# Patient Record
Sex: Female | Born: 1990 | Race: White | Hispanic: No | Marital: Single | Smoking: Former smoker
Health system: Southern US, Community
[De-identification: ages and names within clinical notes are randomized; demographics above are authoritative.]

## PROBLEM LIST (undated history)

## (undated) DIAGNOSIS — G8929 Other chronic pain: Secondary | ICD-10-CM

## (undated) DIAGNOSIS — Z8639 Personal history of other endocrine, nutritional and metabolic disease: Secondary | ICD-10-CM

## (undated) DIAGNOSIS — M419 Scoliosis, unspecified: Secondary | ICD-10-CM

## (undated) DIAGNOSIS — G319 Degenerative disease of nervous system, unspecified: Secondary | ICD-10-CM

## (undated) DIAGNOSIS — R109 Unspecified abdominal pain: Secondary | ICD-10-CM

## (undated) DIAGNOSIS — I071 Rheumatic tricuspid insufficiency: Secondary | ICD-10-CM

## (undated) HISTORY — DX: Scoliosis, unspecified: M41.9

## (undated) HISTORY — DX: Other chronic pain: G89.29

## (undated) HISTORY — DX: Degenerative disease of nervous system, unspecified: G31.9

## (undated) HISTORY — DX: Unspecified abdominal pain: R10.9

## (undated) HISTORY — PX: OTHER SURGICAL HISTORY: SHX169

## (undated) HISTORY — PX: LEEP: SHX91

## (undated) HISTORY — DX: Personal history of other endocrine, nutritional and metabolic disease: Z86.39

---
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
## 2012-02-29 ENCOUNTER — Encounter (HOSPITAL_COMMUNITY): Payer: Self-pay | Admitting: Emergency Medicine

## 2012-02-29 ENCOUNTER — Emergency Department (HOSPITAL_COMMUNITY)
Admission: EM | Admit: 2012-02-29 | Discharge: 2012-02-29 | Disposition: A | Discharge status: Home / Self Care | Payer: Self-pay | Attending: Emergency Medicine | Admitting: Emergency Medicine

## 2012-02-29 ENCOUNTER — Emergency Department (HOSPITAL_COMMUNITY): Payer: Self-pay

## 2012-02-29 DIAGNOSIS — J029 Acute pharyngitis, unspecified: Secondary | ICD-10-CM | POA: Insufficient documentation

## 2012-02-29 DIAGNOSIS — J111 Influenza due to unidentified influenza virus with other respiratory manifestations: Secondary | ICD-10-CM

## 2012-02-29 DIAGNOSIS — IMO0001 Reserved for inherently not codable concepts without codable children: Secondary | ICD-10-CM | POA: Insufficient documentation

## 2012-02-29 DIAGNOSIS — Z79899 Other long term (current) drug therapy: Secondary | ICD-10-CM | POA: Insufficient documentation

## 2012-02-29 DIAGNOSIS — Z8709 Personal history of other diseases of the respiratory system: Secondary | ICD-10-CM | POA: Insufficient documentation

## 2012-02-29 DIAGNOSIS — R490 Dysphonia: Secondary | ICD-10-CM | POA: Insufficient documentation

## 2012-02-29 DIAGNOSIS — R059 Cough, unspecified: Secondary | ICD-10-CM | POA: Insufficient documentation

## 2012-02-29 DIAGNOSIS — J3489 Other specified disorders of nose and nasal sinuses: Secondary | ICD-10-CM | POA: Insufficient documentation

## 2012-02-29 DIAGNOSIS — R05 Cough: Secondary | ICD-10-CM | POA: Insufficient documentation

## 2012-02-29 DIAGNOSIS — R509 Fever, unspecified: Secondary | ICD-10-CM | POA: Insufficient documentation

## 2012-02-29 DIAGNOSIS — R51 Headache: Secondary | ICD-10-CM | POA: Insufficient documentation

## 2012-02-29 DIAGNOSIS — R000 Tachycardia, unspecified: Secondary | ICD-10-CM | POA: Insufficient documentation

## 2012-02-29 MED ORDER — HYDROCOD POLST-CHLORPHEN POLST 10-8 MG/5ML PO LQCR
5.0000 mL | Freq: Once | ORAL | Status: AC
  Administered 2012-02-29: 5 mL via ORAL
  Filled 2012-02-29: qty 5

## 2012-02-29 MED ORDER — PROMETHAZINE-CODEINE 6.25-10 MG/5ML PO SYRP: 5.0000 mL | ORAL_SOLUTION | Freq: Four times a day (QID) | ORAL | Status: DC | PRN

## 2012-02-29 MED ORDER — IBUPROFEN 800 MG PO TABS
800.0000 mg | ORAL_TABLET | Freq: Once | ORAL | Status: AC
  Administered 2012-02-29: 800 mg via ORAL
  Filled 2012-02-29: qty 1

## 2012-02-29 NOTE — ED Provider Notes (Signed)
Medical screening examination/treatment/procedure(s) were performed by non-physician practitioner and as supervising physician I was immediately available for consultation/collaboration.   Antionetta Ator L Faven Watterson, MD 02/29/12 1413 

## 2012-02-29 NOTE — ED Notes (Signed)
Pt c/o sore throat/fever/chills/cough/congestion x 3 days.

## 2012-02-29 NOTE — ED Provider Notes (Signed)
History     CSN: [id number]  Arrival date & time 02/29/12  0801   First MD Initiated Contact with Patient 02/29/12 [phone]      Chief Complaint  Patient presents with  . Sore Throat  . Cough  . Nasal Congestion    (Consider location/radiation/quality/duration/timing/severity/associated sxs/prior treatment) Patient is a 21 y.o. female presenting with cough. The history is provided by the patient.  Cough This is a new problem. The current episode started more than 2 days ago. The problem occurs every few minutes. The problem has been gradually worsening. The cough is non-productive. The maximum temperature recorded prior to her arrival was 102 to 102.9 F. Associated symptoms include chills, headaches, sore throat and myalgias. Pertinent negatives include no chest pain, no shortness of breath and no wheezing. Associated symptoms comments: hoarseness. Treatments tried: tylenol, ibuprofen. The treatment provided no relief. She is not a smoker. Her past medical history is significant for bronchitis. Her past medical history does not include pneumonia or asthma.    History reviewed. No pertinent past medical history.  History reviewed. No pertinent past surgical history.  No family history on file.  History  Substance Use Topics  . Smoking status: Never Smoker   . Smokeless tobacco: Not on file  . Alcohol Use: No    OB History    Grav Para Term Preterm Abortions TAB SAB Ect Mult Living                  Review of Systems  Constitutional: Positive for chills. Negative for activity change.       All ROS Neg except as noted in HPI  HENT: Positive for sore throat. Negative for nosebleeds and neck pain.   Eyes: Negative for photophobia and discharge.  Respiratory: Positive for cough. Negative for shortness of breath and wheezing.   Cardiovascular: Negative for chest pain and palpitations.  Gastrointestinal: Negative for abdominal pain and blood in stool.  Genitourinary: Negative for  dysuria, frequency and hematuria.  Musculoskeletal: Positive for myalgias. Negative for back pain and arthralgias.  Skin: Negative.   Neurological: Positive for headaches. Negative for dizziness, seizures and speech difficulty.  Psychiatric/Behavioral: Negative for hallucinations and confusion.    Allergies  Review of patient's allergies indicates no known allergies.  Home Medications   Current Outpatient Rx  Name  Route  Sig  Dispense  Refill  . IBUPROFEN 200 MG PO TABS   Oral   Take 600 mg by mouth every 6 (six) hours as needed. For headaches/fever         . PSEUDOEPH-DOXYLAMINE-DM-APAP 60-7.08-04-998 MG/30ML PO LIQD   Oral   Take 30 mLs by mouth at bedtime.         Marland Kitchen PSEUDOEPHEDRINE-APAP-DM 86-578-46 MG/30ML PO LIQD   Oral   Take 30 mLs by mouth daily.           BP 120/69  Pulse 107  Temp 98.4 F (36.9 C)  Resp 20  Ht 5\' 5"  (1.651 m)  Wt 140 lb (63.504 kg)  BMI 23.30 kg/m2  SpO2 97%  LMP 01/12/2012  Physical Exam  Nursing note and vitals reviewed. Constitutional: She is oriented to person, place, and time. She appears well-developed and well-nourished.  Non-toxic appearance.  HENT:  Head: Normocephalic.  Right Ear: Tympanic membrane and external ear normal.  Left Ear: Tympanic membrane and external ear normal.       Nasal congestion. Mild to mod hoarseness noted. Airway patent. Minimal redness of the posterior pharynx.  Eyes: EOM and lids are normal. Pupils are equal, round, and reactive to light.  Neck: Normal range of motion. Neck supple. Carotid bruit is not present.  Cardiovascular: Regular rhythm, normal heart sounds, intact distal pulses and normal pulses.  Tachycardia present.   Pulmonary/Chest: No respiratory distress.       course breath sounds. Symmetrical rise and fall of the chest.  Abdominal: Soft. Bowel sounds are normal. There is no tenderness. There is no guarding.  Musculoskeletal: Normal range of motion. She exhibits no edema.   Lymphadenopathy:       Head (right side): No submandibular adenopathy present.       Head (left side): No submandibular adenopathy present.    She has no cervical adenopathy.  Neurological: She is alert and oriented to person, place, and time. She has normal strength. No cranial nerve deficit or sensory deficit. She exhibits normal muscle tone. Coordination normal.  Skin: Skin is warm and dry. No rash noted.  Psychiatric: She has a normal mood and affect. Her speech is normal.    ED Course  Procedures (including critical care time)  Labs Reviewed - No data to display Dg Chest 2 View  02/29/2012  *RADIOLOGY REPORT*  Clinical Data: Cough, congestion, fever  CHEST - 2 VIEW  Comparison:  None.  Findings:  The heart size and mediastinal contours are within normal limits.  Both lungs are clear.  The visualized skeletal structures are unremarkable.  IMPRESSION: No active cardiopulmonary disease.   Original Report Authenticated By: Judie Petit. Miles Costain, M.D.      No diagnosis found.    MDM  I have reviewed nursing notes, vital signs, and all appropriate lab and imaging results for this patient. The chest x-ray is negative for acute cardiopulmonary disease. The pulse rate is elevated at 107, the vital signs are otherwise within normal limits. Review of the patient's history and examination suggest influenza. Patient advised to increase fluids, wash hands frequently, use ibuprofen every 6 hours for fever and aching, and prescription given for promethazine cough medication every 6 hours. Patient to return if any changes, problems, or concerns.       Kathie Dike, [place] 02/29/12 1017

## 2012-03-03 ENCOUNTER — Encounter (HOSPITAL_COMMUNITY): Payer: Self-pay | Admitting: *Deleted

## 2012-03-03 ENCOUNTER — Emergency Department (HOSPITAL_COMMUNITY)
Admission: EM | Admit: 2012-03-03 | Discharge: 2012-03-03 | Disposition: A | Discharge status: Home / Self Care | Payer: Self-pay | Attending: Emergency Medicine | Admitting: Emergency Medicine

## 2012-03-03 DIAGNOSIS — J0110 Acute frontal sinusitis, unspecified: Secondary | ICD-10-CM | POA: Insufficient documentation

## 2012-03-03 DIAGNOSIS — J029 Acute pharyngitis, unspecified: Secondary | ICD-10-CM | POA: Insufficient documentation

## 2012-03-03 DIAGNOSIS — J3489 Other specified disorders of nose and nasal sinuses: Secondary | ICD-10-CM | POA: Insufficient documentation

## 2012-03-03 DIAGNOSIS — Z79899 Other long term (current) drug therapy: Secondary | ICD-10-CM | POA: Insufficient documentation

## 2012-03-03 DIAGNOSIS — R509 Fever, unspecified: Secondary | ICD-10-CM | POA: Insufficient documentation

## 2012-03-03 DIAGNOSIS — R0982 Postnasal drip: Secondary | ICD-10-CM | POA: Insufficient documentation

## 2012-03-03 DIAGNOSIS — IMO0001 Reserved for inherently not codable concepts without codable children: Secondary | ICD-10-CM | POA: Insufficient documentation

## 2012-03-03 MED ORDER — BENZONATATE 100 MG PO CAPS: 200.0000 mg | ORAL_CAPSULE | Freq: Three times a day (TID) | ORAL | Status: DC | PRN

## 2012-03-03 MED ORDER — AMOXICILLIN-POT CLAVULANATE 875-125 MG PO TABS: 1.0000 | ORAL_TABLET | Freq: Two times a day (BID) | ORAL | Status: DC

## 2012-03-03 MED ORDER — HYDROCODONE-ACETAMINOPHEN 7.5-500 MG/15ML PO SOLN: 10.0000 mL | Freq: Four times a day (QID) | ORAL | Status: DC | PRN

## 2012-03-03 NOTE — ED Notes (Signed)
Pt says she has been sick for 8 days.  Nasal congestion , green-brown sputum.  No NVD, seen here and dx with flu.  But says she is no better.

## 2012-03-03 NOTE — ED Notes (Signed)
Pt states she was here recently and dx with the flu. States productive cough, green in color and congestion has gotten worse. Denies N/V/D

## 2012-03-05 NOTE — ED Provider Notes (Signed)
Medical screening examination/treatment/procedure(s) were performed by non-physician practitioner and as supervising physician I was immediately available for consultation/collaboration.  Rodrickus Min, MD 03/05/12 1533 

## 2012-03-05 NOTE — ED Provider Notes (Signed)
History     CSN: [id number]  Arrival date & time 03/03/12  1041   First MD Initiated Contact with Patient 03/03/12 1057      Chief Complaint  Patient presents with  . Cough  . Nasal Congestion    (Consider location/radiation/quality/duration/timing/severity/associated sxs/prior treatment) HPI Comments: Madison Pratt presents for further evaluation of flu like symptoms stating she feels worse than when she was seen here 3 days ago.  She describes a 8 day history of sore throat, nasal congestion, fever, chills, myalgias and nonproductive cough. Her chest xray 3 days ago was normal and she currently denies any worsened chest complaint such as sob, wheezing or worse cough,  But she has developed increased thick green and brown tinged nasal congestion and post nasal drip along with increased sinus pain and pressure. She was prescribed phenergan with codeine which has not slowed her cough.  She is also using ibuprofen and nyquil without much relief.  She has not slept well in 2 days secondary to cough.  She is fatigued,  But denies weakness or feeling lightheaded.  The history is provided by the patient.    History reviewed. No pertinent past medical history.  Past Surgical History  Procedure Date  . Cranial surgery     No family history on file.  History  Substance Use Topics  . Smoking status: Never Smoker   . Smokeless tobacco: Not on file  . Alcohol Use: No    OB History    Grav Para Term Preterm Abortions TAB SAB Ect Mult Living                  Review of Systems  Constitutional: Positive for fever and chills.  HENT: Positive for congestion, sore throat, rhinorrhea, postnasal drip and sinus pressure. Negative for ear pain and neck pain.   Eyes: Negative.  Negative for discharge.  Respiratory: Positive for cough. Negative for chest tightness and shortness of breath.   Cardiovascular: Negative for chest pain.  Gastrointestinal: Negative for nausea and abdominal pain.    Genitourinary: Negative.   Musculoskeletal: Positive for myalgias. Negative for joint swelling and arthralgias.  Skin: Negative.  Negative for rash and wound.  Neurological: Negative for dizziness, weakness, light-headedness, numbness and headaches.  Hematological: Negative.   Psychiatric/Behavioral: Negative.     Allergies  Review of patient's allergies indicates no known allergies.  Home Medications   Current Outpatient Rx  Name  Route  Sig  Dispense  Refill  . AMOXICILLIN-POT CLAVULANATE 875-125 MG PO TABS   Oral   Take 1 tablet by mouth every 12 (twelve) hours.   20 tablet   0   . BENZONATATE 100 MG PO CAPS   Oral   Take 2 capsules (200 mg total) by mouth 3 (three) times daily as needed for cough.   21 capsule   0   . HYDROCODONE-ACETAMINOPHEN 7.5-500 MG/15ML PO SOLN   Oral   Take 10 mLs by mouth every 6 (six) hours as needed for pain or cough.   120 mL   0   . IBUPROFEN 200 MG PO TABS   Oral   Take 600 mg by mouth every 6 (six) hours as needed. For headaches/fever         . PROMETHAZINE-CODEINE 6.25-10 MG/5ML PO SYRP   Oral   Take 5 mLs by mouth every 6 (six) hours as needed for cough.   150 mL   0   . PSEUDOEPH-DOXYLAMINE-DM-APAP 60-7.08-04-998 MG/30ML PO LIQD  Oral   Take 30 mLs by mouth at bedtime.         Marland Kitchen PSEUDOEPHEDRINE-APAP-DM 16-109-60 MG/30ML PO LIQD   Oral   Take 30 mLs by mouth daily.           BP 129/83  Pulse 105  Temp 98.2 F (36.8 C) (Oral)  Resp 16  Ht 5\' 5"  (1.651 m)  Wt 140 lb (63.504 kg)  BMI 23.30 kg/m2  SpO2 100%  LMP 02/11/2012  Physical Exam  Constitutional: She is oriented to person, place, and time. She appears well-developed and well-nourished.  HENT:  Head: Normocephalic and atraumatic.  Right Ear: Tympanic membrane, external ear and ear canal normal.  Left Ear: Tympanic membrane, external ear and ear canal normal.  Nose: Mucosal edema and rhinorrhea present. No epistaxis. Right sinus exhibits frontal  sinus tenderness.  Mouth/Throat: Uvula is midline and mucous membranes are normal. Posterior oropharyngeal erythema present. No oropharyngeal exudate, posterior oropharyngeal edema or tonsillar abscesses.  Eyes: Conjunctivae normal are normal.  Cardiovascular: Normal rate and normal heart sounds.   Pulmonary/Chest: Effort normal. No respiratory distress. She has no decreased breath sounds. She has no wheezes. She has no rhonchi. She has no rales.  Abdominal: Soft. There is no tenderness.  Musculoskeletal: Normal range of motion.  Neurological: She is alert and oriented to person, place, and time.  Skin: Skin is warm and dry. No rash noted.  Psychiatric: She has a normal mood and affect.    ED Course  Procedures (including critical care time)  Labs Reviewed - No data to display No results found.   1. Sinusitis, acute frontal       MDM  Prior xray reviewed.  Lung exam today is normal,  No indication to repeat cxray.  Pt with influenza/viral type illness which appears to have progressed to sinusitis.  She was prescribed augmentin,  Also prescribed tessalon for cough - dose given in ed helped "a little",  So also prescribed lortab elixer for improved cough suppression and so pt can rest. Cautioned re tylenol in dayquil and nyqul products - in addition to tylenol in lortab prescription.  Rest, fluids.  Return for any worsened sx.        Burgess Amor, PA 03/05/12 1149

## 2012-04-25 ENCOUNTER — Emergency Department (HOSPITAL_COMMUNITY)
Admission: EM | Admit: 2012-04-25 | Discharge: 2012-04-25 | Disposition: A | Discharge status: Home / Self Care | Payer: Self-pay | Attending: Emergency Medicine | Admitting: Emergency Medicine

## 2012-04-25 ENCOUNTER — Encounter (HOSPITAL_COMMUNITY): Payer: Self-pay

## 2012-04-25 DIAGNOSIS — T7492XA Unspecified child maltreatment, confirmed, initial encounter: Secondary | ICD-10-CM | POA: Insufficient documentation

## 2012-04-25 DIAGNOSIS — S5010XA Contusion of unspecified forearm, initial encounter: Secondary | ICD-10-CM | POA: Insufficient documentation

## 2012-04-25 DIAGNOSIS — S0083XA Contusion of other part of head, initial encounter: Secondary | ICD-10-CM

## 2012-04-25 DIAGNOSIS — T7491XA Unspecified adult maltreatment, confirmed, initial encounter: Secondary | ICD-10-CM | POA: Insufficient documentation

## 2012-04-25 DIAGNOSIS — Z9889 Other specified postprocedural states: Secondary | ICD-10-CM | POA: Insufficient documentation

## 2012-04-25 DIAGNOSIS — IMO0002 Reserved for concepts with insufficient information to code with codable children: Secondary | ICD-10-CM | POA: Insufficient documentation

## 2012-04-25 DIAGNOSIS — S8010XA Contusion of unspecified lower leg, initial encounter: Secondary | ICD-10-CM | POA: Insufficient documentation

## 2012-04-25 DIAGNOSIS — S40029A Contusion of unspecified upper arm, initial encounter: Secondary | ICD-10-CM | POA: Insufficient documentation

## 2012-04-25 DIAGNOSIS — T07XXXA Unspecified multiple injuries, initial encounter: Secondary | ICD-10-CM

## 2012-04-25 DIAGNOSIS — S0003XA Contusion of scalp, initial encounter: Secondary | ICD-10-CM | POA: Insufficient documentation

## 2012-04-25 MED ORDER — IBUPROFEN 800 MG PO TABS: 800.0000 mg | ORAL_TABLET | Freq: Once | ORAL | Status: DC
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
## 2012-12-08 ENCOUNTER — Encounter (HOSPITAL_COMMUNITY): Payer: Self-pay

## 2012-12-08 ENCOUNTER — Emergency Department (HOSPITAL_COMMUNITY)
Admission: EM | Admit: 2012-12-08 | Discharge: 2012-12-08 | Disposition: A | Discharge status: Home / Self Care | Payer: Self-pay | Attending: Emergency Medicine | Admitting: Emergency Medicine

## 2012-12-08 DIAGNOSIS — L299 Pruritus, unspecified: Secondary | ICD-10-CM | POA: Insufficient documentation

## 2012-12-08 DIAGNOSIS — R21 Rash and other nonspecific skin eruption: Secondary | ICD-10-CM | POA: Insufficient documentation

## 2012-12-08 MED ORDER — PERMETHRIN 5 % EX CREA: TOPICAL_CREAM | CUTANEOUS | Status: DC

## 2012-12-08 NOTE — ED Notes (Signed)
Complain of scattered rash and itching for a week

## 2012-12-08 NOTE — ED Provider Notes (Signed)
CSN: [id number]     Arrival date & time 12/08/12  4098 History  This chart was scribed for Joya Gaskins, MD by Bennett Scrape, ED Scribe. This patient was seen in room APA01/[facility]01 and the patient's care was started at 8:54 AM.   Chief Complaint  Patient presents with  . Rash    Patient is a 22 y.o. female presenting with rash. The history is provided by the patient. No language interpreter was used.  Rash Location:  Leg Leg rash location:  L upper leg, R upper leg, L lower leg, R lower leg, L toes and R toes Quality: itchiness and redness   Onset quality:  Gradual Duration:  1 week Timing:  Constant Progression:  Spreading Chronicity:  New Context: not insect bite/sting and not sick contacts   Relieved by:  Nothing Worsened by:  Nothing tried Ineffective treatments:  None tried Associated symptoms: no shortness of breath, no throat swelling and no tongue swelling     HPI Comments: Madison Pratt is a 22 y.o. female who presents to the Emergency Department complaining of red and itchy rash that started in between her toes and has spread up to her bilateral thighs and antecubital regions for the past week. She [place] but denies any known contact with it. She denies having a h/o allergies. She does not remember a specific insect bite and denies any sick contacts in her home currently. Husband states that he has had a much milder version of the same rash that resolved after a few weeks. Pt denies SOB, CP, trouble swallowing as associated symptoms.  History reviewed. No pertinent past medical history. Past Surgical History  Procedure Laterality Date  . Cranial surgery     No family history on file. History  Substance Use Topics  . Smoking status: Never Smoker   . Smokeless tobacco: Not on file  . Alcohol Use: No   No OB history provided.  Review of Systems  HENT: Negative for trouble swallowing.   Respiratory: Negative for shortness of  breath.   Skin: Positive for rash.    Allergies  Review of patient's allergies indicates no known allergies.  Home Medications   Current Outpatient Rx  Name  Route  Sig  Dispense  Refill  . ibuprofen (ADVIL,MOTRIN) 200 MG tablet   Oral   Take 600 mg by mouth every 6 (six) hours as needed. For headaches/fever          Triage Vitals: BP 124/77  Pulse 83  Temp(Src) 97.9 F (36.6 C) (Oral)  Resp 18  SpO2 99%  LMP 12/01/2012  Physical Exam  Nursing note and vitals reviewed.  CONSTITUTIONAL: Well developed/well nourished HEAD: Normocephalic/atraumatic EYES: EOMI ENMT: Mucous membranes moist NECK: supple no meningeal signs NEURO: Pt is awake/alert, moves all extremitiesx4 EXTREMITIES: pulses normal, full ROM SKIN: warm, color normal, scattered papules to lower extremities, inner thighs and feet PSYCH: no abnormalities of mood noted;  ED Course  Procedures   DIAGNOSTIC STUDIES: Oxygen Saturation is 99% on room air, normal by my interpretation.    COORDINATION OF CARE: 8:57 AM-Informed pt of suspicion of scabies. Discussed discharge plan which includes cream and benadryl with pt and pt agreed to plan. Also advised pt to follow up as needed within the next few weeks if symptoms and pt agreed. Addressed symptoms to return for with pt.     MDM  No diagnosis found. Nursing notes including past medical history and  social history reviewed and considered in documentation   I personally performed the services described in this documentation, which was scribed in my presence. The recorded information has been reviewed and is accurate.      Joya Gaskins, MD 12/08/12 [phone]

## 2013-11-30 ENCOUNTER — Ambulatory Visit (HOSPITAL_COMMUNITY)
Admission: RE | Admit: 2013-11-30 | Discharge: 2013-11-30 | Disposition: A | Discharge status: Home / Self Care | Payer: Medicaid Other | Source: Ambulatory Visit | Attending: Nurse Practitioner | Admitting: Nurse Practitioner

## 2013-11-30 DIAGNOSIS — R262 Difficulty in walking, not elsewhere classified: Secondary | ICD-10-CM | POA: Diagnosis not present

## 2013-11-30 DIAGNOSIS — R109 Unspecified abdominal pain: Secondary | ICD-10-CM | POA: Diagnosis not present

## 2013-11-30 DIAGNOSIS — IMO0001 Reserved for inherently not codable concepts without codable children: Secondary | ICD-10-CM | POA: Insufficient documentation

## 2013-11-30 NOTE — Evaluation (Signed)
Physical Therapy Evaluation  Patient Details  Name: Madison Pratt MRN: [id number] Date of Birth: 1991-01-05  Today's Date: 11/30/2013 Time: 8469-6295 PT Time Calculation (min): 40 min     Charges: 1 Evaluation         Visit#: 1 of 1  Re-eval:   Assessment Diagnosis: Abdominal weakness Surgical Date: 09/29/13 Prior Therapy: no  Authorization: Medicaid     Past Medical History: No past medical history on file. Past Surgical History:  Past Surgical History  Procedure Laterality Date  . Cranial surgery      Subjective Symptoms/Limitations Pertinent History: Patient states she had her baby 2 months ago and she was told her abdominal muslces have not healed correctly. Patient had a c-section. Patient plays soccer, exercises a lot. Patient notes pain with any exercise now. negative hernia an other problems accorting to CT scan.  How long can you sit comfortably?: no How long can you stand comfortably?: no How long can you walk comfortably?: no Pain Assessment Currently in Pain?: Yes Pain Score: 10-Worst pain ever Pain Location: Umbilicus Pain Orientation: Medial Pain Radiating Towards: lower abdominals and mid back. Pain Relieving Factors: nothing Effect of Pain on Daily Activities: pain with everything.   Cognition/Observation Observation/Other Assessments Observations: Gait: WNL Other Assessments: Trunk/abdominal pain 3D hip excursions  Sensation/Coordination/Flexibility/Functional Tests Functional Tests Functional Tests: 2D Thoracic spine excursions: no pain with Rt side bending or twisting to Rt, increased pain with Lt sidebending/twisting  Assessment Lumbar Strength Overall Lumbar Strength Comments: painfull with flexion and extension.  Lumbar Flexion: 3+/5 Lumbar Extension: 3+/5  Physical Therapy Assessment and Plan PT Assessment and Plan Clinical Impression Statement: Patient displays abdominal pain following cecerian section 2 months ago. Pain has been  present ever since giving birth, Patient was able to perform quadraped exercises for abdominal strengtheing without pain and low level plankg exercises to a raised table without pain. Patient noted no increase in pain during or following these exercises only the weakness in her trunk. patient would benefit from conitnued skilled therapy to increase core strength but is unable to fford further therapy.  Remainder of session utilized to educate patient in performance of exercises for HEP to strengtheen and improve mobility of abdominal muscles.  Pt will benefit from skilled therapeutic intervention in order to improve on the following deficits: Decreased activity tolerance;Increased fascial restricitons;Pain;Decreased strength;Difficulty walking;Impaired flexibility Rehab Potential: Excellent PT Plan: Patient discharged with HEP    Problem List There are no active problems to display for this patient.   PT - End of Session Activity Tolerance: Patient tolerated treatment well General Behavior During Therapy: WFL for tasks assessed/performed PT Plan of Care PT Home Exercise Plan: Quadraped exercise progresson, 3D UE weight shifts, UE ground matrix, LE ground matrix. 3D thoracic spine excursions with overhead reaches  GP    Said Rueb R 11/30/2013, 2:10 PM  Physician Documentation Your signature is required to indicate approval of the treatment plan as stated above.  Please sign and either send electronically or make a copy of this report for your files and return this physician signed original.   Please mark one 1.__approve of plan  2. ___approve of plan with the following conditions.   ______________________________  _____________________ Physician Signature                                                                                                             Date

## 2013-12-06 ENCOUNTER — Encounter: Payer: Medicaid Other | Admitting: Obstetrics & Gynecology

## 2013-12-11 ENCOUNTER — Ambulatory Visit (INDEPENDENT_AMBULATORY_CARE_PROVIDER_SITE_OTHER): Payer: Medicaid Other | Admitting: Obstetrics & Gynecology

## 2013-12-11 ENCOUNTER — Encounter: Payer: Self-pay | Admitting: Obstetrics & Gynecology

## 2013-12-11 VITALS — BP 120/80 | Ht 65.0 in | Wt 162.0 lb

## 2013-12-11 DIAGNOSIS — R87611 Atypical squamous cells cannot exclude high grade squamous intraepithelial lesion on cytologic smear of cervix (ASC-H): Secondary | ICD-10-CM

## 2013-12-11 NOTE — Progress Notes (Signed)
Patient ID: Morton Stallshley N Storti, female   DOB: 1990-11-05, 23 y.o.   MRN: [id number] Colposcopy Procedure Note:  [facility]  [facility] Result:  ASCUS cannot rule out high grade dysplasia Smoker:  No. New sexual partner:  No.  : time frame:    History of abnormal Pap: no  Due to the indications listed above, a thorough colposcopic evaluation of the cervix and upper vagina was performed in the usual fashion using 3% acetic acid.  The findings of the visual inspection are noted below:  adequate Acetowhite changes       positive Punctation                      negative Mosaicism                      negative Abnormal Vessels          negative  Biopsy performed:          No.  Complications: no   Impression: HPV atypia  Recommendation: Follow up cytology in 6 months      History reviewed. No pertinent past medical history.  Past Surgical History  Procedure Laterality Date  . Cranial surgery      OB History   Grav Para Term Preterm Abortions TAB SAB Ect Mult Living                  No Known Allergies  History   Social History  . Marital Status: Single    Spouse Name: N/A    Number of Children: N/A  . Years of Education: N/A   Social History Main Topics  . Smoking status: Former Games developermoker  . Smokeless tobacco: None  . Alcohol Use: No  . Drug Use: No  . Sexual Activity: No   Other Topics Concern  . None   Social History Narrative  . None    History reviewed. No pertinent family history.

## 2013-12-11 NOTE — Addendum Note (Signed)
Addended by: Criss AlvinePULLIAM, Danyah Guastella G on: 12/11/2013 04:02 PM   Modules accepted: Orders

## 2013-12-12 ENCOUNTER — Telehealth: Payer: Self-pay | Admitting: Obstetrics and Gynecology

## 2013-12-17 NOTE — Telephone Encounter (Signed)
Answered pt questions in regards to patient waiting 6 mos and repeating pap at that time. Pt verbalized understanding.

## 2014-03-12 ENCOUNTER — Encounter: Payer: Self-pay | Admitting: Internal Medicine

## 2014-04-10 ENCOUNTER — Ambulatory Visit: Payer: Medicaid Other | Admitting: Gastroenterology

## 2014-04-24 ENCOUNTER — Encounter (INDEPENDENT_AMBULATORY_CARE_PROVIDER_SITE_OTHER): Payer: Self-pay

## 2014-04-24 ENCOUNTER — Encounter: Payer: Self-pay | Admitting: Nurse Practitioner

## 2014-04-24 ENCOUNTER — Ambulatory Visit (INDEPENDENT_AMBULATORY_CARE_PROVIDER_SITE_OTHER): Payer: Medicaid Other | Admitting: Nurse Practitioner

## 2014-04-24 VITALS — BP 131/86 | HR 102 | Temp 98.5°F | Ht 65.0 in | Wt 165.0 lb

## 2014-04-24 DIAGNOSIS — R1013 Epigastric pain: Secondary | ICD-10-CM | POA: Insufficient documentation

## 2014-04-24 DIAGNOSIS — R1084 Generalized abdominal pain: Secondary | ICD-10-CM

## 2014-04-24 MED ORDER — OMEPRAZOLE 20 MG PO CPDR: 20.0000 mg | DELAYED_RELEASE_CAPSULE | Freq: Every day | ORAL | Status: DC

## 2014-04-24 NOTE — Patient Instructions (Addendum)
1. We will start you on an acid blocker medicine: Prilosec 20 mg once a day, 30 minutes before your first meal of the day. 2. Avoid NSAID medications (Advil, Motrin, Ibuprofen, Aspirin, Goody/BC powders) 3. Follow-up in 4 weeks to check on how you're doing.

## 2014-04-24 NOTE — Progress Notes (Signed)
Primary Care Physician:  [facility] Primary Gastroenterologist:  Dr. Jena Gauss  Chief Complaint  Patient presents with  . Abdominal Pain    HPI:   24 year old female presents c/o abdominal pain. Was seen by PCP on 03/11/14 when she stated she has had constant abdominal pain all over since a CSection 5 months ago, health department was her OB provider. Was referred to [facility] where she had an ultrasound which showed no abnormalities. Went to [facility] ED and was set-up with OB in [place] where she had a colposcopy because of abnormal cervical cells, although patient is unaware of the results. She was also seen at [facility] and referred for OB/GYN who told her that because of the number of doctors and tests that had been done that there ws nothing left for OB/GYN to do and she should see GI.  Patient requested GI referral from PCP who made a referral to [facility] based on patient preference who declined to accept her with letter sent to patietn with explanation. Per PCP notes, patient states pain is constant with nothing making it better or worse and it started immediately after her CSection and has not gotten any better. Any movement is difficult. Previous PCP OV note on 02/26/14 make no mention by patient of abdominal pain. 3  The patient states she has had a lot of imaging, including CT at [facility] about 3 months ago which, per the patient, she was not given CT results but told she needed to go back to the surgeon. States some providers have said she had an incisional hernia, and some have told her that her abdominal muscles grew back irregularly. States her abdominal pain is generalized and all over, sometimes just behind the scar. Pain started about 1 month after the birth/C-Section. It is constant and every day 10/10 all the time. Admits nausea, denies vomiting. Denies hematochezia, melena. Has a bowel movement typically at least once a day and is consistent with a 4 on the  bristol stool scale. Has heartburn symptoms about once a week which started 1 month ago. Takes NSAIDs daily once in the morning. Has a gallbladder. Denies any other upper or lower GI symptoms.   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 Chronic abdominal pain     Past Surgical History  Procedure Laterality Date  . Cranial surgery    . Cesarean section  09/2013  . Tonsillectomy  2001    Current Outpatient Prescriptions  Medication Sig Dispense Refill  . etonogestrel (NEXPLANON) 68 MG IMPL implant 1 each by Subdermal route once.    Marland Kitchen ibuprofen (ADVIL,MOTRIN) 200 MG tablet Take 600 mg by mouth every 6 (six) hours as needed. For headaches/fever     No current facility-administered medications for this visit.    Allergies as of 04/24/2014  . (No Known Allergies)    Family History  Problem Relation Age of Onset  . Colon cancer Neg Hx   . Ovarian cancer Mother     History   Social History  . Marital Status: Single    Spouse Name: N/A  . Number of Children:  N/A  . Years of Education: N/A   Occupational History  . Not on file.   Social History Main Topics  . Smoking status: Former Smoker    Quit date: 09/05/2012  . Smokeless tobacco: Never Used  . Alcohol Use: No     Comment: quit drinking 2014; Previously had 1 beer/day  . Drug Use: No  . Sexual Activity: No   Other Topics Concern  . Not on file   Social History Narrative    Review of Systems: Gen: Denies any fever, chills, fatigue, weight loss, lack of appetite.  CV:  Denies chest pain, heart palpitations, peripheral edema, syncope.  Resp: Denies shortness of breath at rest or with exertion. Denies wheezing or cough.  GI: See HPI. Denies dysphagia or odynophagia. Denies jaundice, hematemesis, fecal incontinence. MS: Denies joint pain, muscle weakness, cramps, or limitation of movement.  Derm: Denies rash, itching, dry skin Psych: Denies depression, anxiety, memory loss, and confusion Heme: Denies bruising, bleeding, and enlarged lymph nodes.  Physical Exam: BP 131/86 mmHg  Pulse 102  Temp(Src) 98.5 F (36.9 C)  Ht 5\' 5"  (1.651 m)  Wt 165 lb (74.844 kg)  BMI 27.46 kg/m2  LMP 04/22/2014 General:   Alert and oriented. Pleasant and cooperative. Well-nourished and well-developed.  Head:  Normocephalic and atraumatic. Eyes:  Without icterus, sclera clear and conjunctiva pink.  Ears:  Normal auditory acuity. Mouth:  No deformity or lesions, oral mucosa pink.  Neck:  Supple, without mass or thyromegaly. Lungs:  Clear to auscultation bilaterally. No wheezes, rales, or rhonchi. No distress.  Heart:  S1, S2 present without murmurs appreciated.  Abdomen:  +BS, soft, and non-distended. Some tenderness to epigastric palpation. No HSM noted. No guarding or rebound. No masses appreciated. Rectal:  Deferred  Msk:  Symmetrical without gross deformities. Normal posture. Pulses:  Normal pulses noted. Extremities:  Without clubbing or edema. Neurologic:  Alert and  oriented x4;  grossly normal neurologically. Skin:  Intact without significant lesions or rashes. Noted lower abdominal incisional line consistent with history of recent CSection. Cervical Nodes:  No significant cervical adenopathy. Psych:  Alert and cooperative. Normal mood and affect.     04/24/2014 2:52 PM

## 2014-04-24 NOTE — Assessment & Plan Note (Addendum)
Abdominal pain is worse int eh epigastric area with mild epigastric TTP. Takes daily NSAIDs. Associated esophageal burning sensation about 1-2 times a week. Is not on PPI or any other acid blocking therapy. No dysphagia, red flag/warning signs/symptoms. Will trial on omeprazole 20 mg daily 30 mins before the first meal and recheck back in 4 weeks for any changes or improvement.

## 2014-04-24 NOTE — Assessment & Plan Note (Signed)
Generalized abdominal pain for the past 5 months s/p CSection. Has seen multiple OB/GYN doctors with transabdominal, transvagiknal US and CT A&P with no acute finding. Patient states the baby she delivered was 10 pounds and had to care for her (and do lifting) since immediately after surgery. No red flag/warning GI symptoms like occult GI bleed, vomiting, po intolerance, etc. Pain is worse in the epigastric area with associated esophgeal burning. Symptoms likely dyspepsia with residual MS surgical pain. See dyspepsia A&P.

## 2014-04-27 NOTE — Progress Notes (Signed)
CC'ED TO PCP 

## 2014-05-06 ENCOUNTER — Encounter: Payer: Self-pay | Admitting: Internal Medicine

## 2014-05-23 ENCOUNTER — Ambulatory Visit: Payer: Medicaid Other | Admitting: Nurse Practitioner

## 2014-05-27 ENCOUNTER — Ambulatory Visit: Payer: Medicaid Other | Admitting: Nurse Practitioner

## 2014-05-31 ENCOUNTER — Ambulatory Visit: Payer: Medicaid Other | Admitting: Nurse Practitioner

## 2014-06-03 ENCOUNTER — Encounter: Payer: Self-pay | Admitting: Nurse Practitioner

## 2014-06-03 ENCOUNTER — Ambulatory Visit (INDEPENDENT_AMBULATORY_CARE_PROVIDER_SITE_OTHER): Payer: Medicaid Other | Admitting: Nurse Practitioner

## 2014-06-03 VITALS — BP 118/75 | HR 82 | Temp 98.0°F | Ht 65.0 in | Wt 166.2 lb

## 2014-06-03 DIAGNOSIS — R1013 Epigastric pain: Secondary | ICD-10-CM | POA: Diagnosis not present

## 2014-06-03 DIAGNOSIS — R1084 Generalized abdominal pain: Secondary | ICD-10-CM | POA: Diagnosis not present

## 2014-06-03 MED ORDER — OMEPRAZOLE 20 MG PO CPDR: 20.0000 mg | DELAYED_RELEASE_CAPSULE | Freq: Every day | ORAL | Status: DC

## 2014-06-03 NOTE — Assessment & Plan Note (Signed)
Continues with some lower abdominal pain which is felt to be GYN related, has follow-up appt scheduled in the next couple weeks for this. Epigastric pain is resolved. No epigastric TTP whereas last visit had epigastric TTP. Dyspepsia symptoms resolved (see dyspepsia A&P). Continue Omeprazole 20 mg daily, 30 mins before first meal of the day. Will provide refills.

## 2014-06-03 NOTE — Progress Notes (Signed)
cc'ed to pcp °

## 2014-06-03 NOTE — Progress Notes (Signed)
Referring Provider: [facility] Primary Care Physician:  [facility] Primary GI: Dr. Jena Gauss  Chief Complaint  Patient presents with  . Follow-up    HPI:   24 year old female presents for follow-up on abdominal pain. Has had extensive OB/GYN workup without etiology. At last visit pain was primaryily epigastric and was started on PPI trial and recommended 4 week follow-up. Today she states she's doing much better. Upper GI symptoms have resolved. Still has some residual lower abdominal pain which is felt to be GYN related and she has a follow-up appointment scheduled. Denies any recurrent epigastric pain. N/V, excessive belching, bitter taste in her mouth. Continues daily bowel movement which tend to be [facility] Scale 4 without straining. Denies hematochezia and melena.  Past Medical History  Diagnosis Date  . Chronic abdominal pain     Past Surgical History  Procedure Laterality Date  . Cranial surgery    . Cesarean section  09/2013  . Tonsillectomy  2001    Current Outpatient Prescriptions  Medication Sig Dispense Refill  . etonogestrel (NEXPLANON) 68 MG IMPL implant 1 each by Subdermal route once.    Marland Kitchen ibuprofen (ADVIL,MOTRIN) 200 MG tablet Take 600 mg by mouth every 6 (six) hours as needed. For headaches/fever    . omeprazole (PRILOSEC) 20 MG capsule Take 1 capsule (20 mg total) by mouth daily. 30 capsule 1   No current facility-administered medications for this visit.    Allergies as of 06/03/2014  . (No Known Allergies)    Family History  Problem Relation Age of Onset  . Colon cancer Neg Hx   . Ovarian cancer Mother     History   Social History  . Marital Status: Single    Spouse Name: N/A  . Number of Children: N/A  . Years of Education: N/A   Social History Main Topics  . Smoking status: Former Smoker    Quit date: 09/05/2012  . Smokeless tobacco: Never Used  . Alcohol Use: No     Comment: quit drinking 2014;  Previously had 1 beer/day  . Drug Use: No  . Sexual Activity: No   Other Topics Concern  . None   Social History Narrative    Review of Systems: Gen: Denies fever, chills, anorexia. Denies fatigue, weakness, weight loss.  CV: Denies chest pain, palpitations, syncope. Resp: Denies dyspnea at rest, wheezing, coughing up blood. GI: See HPI. Denies vomiting blood, jaundice, and fecal incontinence.   Denies dysphagia or odynophagia. Derm: Denies rash, itching, dry skin Psych: Denies depression, anxiety, memory loss, confusion. Heme: Denies bruising, bleeding, and enlarged lymph nodes.  Physical Exam: BP 118/75 mmHg  Pulse 82  Temp(Src) 98 F (36.7 C)  Ht  (1.651 m)  Wt 166 lb 3.2 oz (75.388 kg)  BMI 27.66 kg/m2  LMP 05/13/2014 General:   Alert and oriented. No distress noted. Pleasant and cooperative.  Head:  Normocephalic and atraumatic. Lungs:  Clear to auscultation bilaterally. No wheezes, rales, or rhonchi. No distress.  Heart:  S1, S2 present without murmurs, rubs, or gallops. Regular rate and rhythm. Abdomen:  +BS, soft, and non-distended. Minimal lower abdominal discomfort. No rebound or guarding. No HSM or masses noted. Msk:  Symmetrical without gross deformities. Normal posture. Pulses:  2+ DP noted bilaterally Extremities:  Without edema. Neurologic:  Alert and  oriented x4;  grossly normal neurologically. Skin:  Intact without significant lesions or rashes. Psych:  Alert and cooperative. Normal mood and  affect.    06/03/2014 8:53 AM

## 2014-06-03 NOTE — Patient Instructions (Addendum)
1. I have sent in another Rx for Prilosec to continue taking 2. Follow-up with us as needed for worsening/return of your acid reflux symptoms. 3. We generally like to see each patient at least every 2 years for continued medication refills. 4. Below is some information on GERD (acid reflux)    Gastroesophageal Reflux Disease, Adult Gastroesophageal reflux disease (GERD) happens when acid from your stomach flows up into the esophagus. When acid comes in contact with the esophagus, the acid causes soreness (inflammation) in the esophagus. Over time, GERD may create small holes (ulcers) in the lining of the esophagus. CAUSES   Increased body weight. This puts pressure on the stomach, making acid rise from the stomach into the esophagus.  Smoking. This increases acid production in the stomach.  Drinking alcohol. This causes decreased pressure in the lower esophageal sphincter (valve or ring of muscle between the esophagus and stomach), allowing acid from the stomach into the esophagus.  Late evening meals and a full stomach. This increases pressure and acid production in the stomach.  A malformed lower esophageal sphincter. Sometimes, no cause is found. SYMPTOMS   Burning pain in the lower part of the mid-chest behind the breastbone and in the mid-stomach area. This may occur twice a week or more often.  Trouble swallowing.  Sore throat.  Dry cough.  Asthma-like symptoms including chest tightness, shortness of breath, or wheezing. DIAGNOSIS  Your caregiver may be able to diagnose GERD based on your symptoms. In some cases, X-rays and other tests may be done to check for complications or to check the condition of your stomach and esophagus. TREATMENT  Your caregiver may recommend over-the-counter or prescription medicines to help decrease acid production. Ask your caregiver before starting or adding any new medicines.  HOME CARE INSTRUCTIONS   Change the factors that you can control.  Ask your caregiver for guidance concerning weight loss, quitting smoking, and alcohol consumption.  Avoid foods and drinks that make your symptoms worse, such as:  Caffeine or alcoholic drinks.  Chocolate.  Peppermint or mint flavorings.  Garlic and onions.  Spicy foods.  Citrus fruits, such as oranges, lemons, or limes.  Tomato-based foods such as sauce, chili, salsa, and pizza.  Fried and fatty foods.  Avoid lying down for the 3 hours prior to your bedtime or prior to taking a nap.  Eat small, frequent meals instead of large meals.  Wear loose-fitting clothing. Do not wear anything tight around your waist that causes pressure on your stomach.  Raise the head of your bed 6 to 8 inches with wood blocks to help you sleep. Extra pillows will not help.  Only take over-the-counter or prescription medicines for pain, discomfort, or fever as directed by your caregiver.  Do not take aspirin, ibuprofen, or other nonsteroidal anti-inflammatory drugs (NSAIDs). SEEK IMMEDIATE MEDICAL CARE IF:   You have pain in your arms, neck, jaw, teeth, or back.  Your pain increases or changes in intensity or duration.  You develop nausea, vomiting, or sweating (diaphoresis).  You develop shortness of breath, or you faint.  Your vomit is green, yellow, black, or looks like coffee grounds or blood.  Your stool is red, bloody, or black. These symptoms could be signs of other problems, such as heart disease, gastric bleeding, or esophageal bleeding. MAKE SURE YOU:   Understand these instructions.  Will watch your condition.  Will get help right away if you are not doing well or get worse. Document Released: 12/02/2004 Document Revised: 05/17/2011  Document Reviewed: 09/11/2010 [facility] Patient Information 2015 [place], [place]. This information is not intended to replace advice given to you by your health care provider. Make sure you discuss any questions you have with your health care  provider.     Food Choices for Gastroesophageal Reflux Disease When you have gastroesophageal reflux disease (GERD), the foods you eat and your eating habits are very important. Choosing the right foods can help ease the discomfort of GERD. WHAT GENERAL GUIDELINES DO I NEED TO FOLLOW?  Choose fruits, vegetables, whole grains, low-fat dairy products, and low-fat meat, fish, and poultry.  Limit fats such as oils, salad dressings, butter, nuts, and avocado.  Keep a food diary to identify foods that cause symptoms.  Avoid foods that cause reflux. These may be different for different people.  Eat frequent small meals instead of three large meals each day.  Eat your meals slowly, in a relaxed setting.  Limit fried foods.  Cook foods using methods other than frying.  Avoid drinking alcohol.  Avoid drinking large amounts of liquids with your meals.  Avoid bending over or lying down until 2-3 hours after eating. WHAT FOODS ARE NOT RECOMMENDED? The following are some foods and drinks that may worsen your symptoms: Vegetables Tomatoes. Tomato juice. Tomato and spaghetti sauce. Chili peppers. Onion and garlic. Horseradish. Fruits Oranges, grapefruit, and lemon (fruit and juice). Meats High-fat meats, fish, and poultry. This includes hot dogs, ribs, ham, sausage, salami, and bacon. Dairy Whole milk and chocolate milk. Sour cream. Cream. Butter. Ice cream. Cream cheese.  Beverages Coffee and tea, with or without caffeine. Carbonated beverages or energy drinks. Condiments Hot sauce. Barbecue sauce.  Sweets/Desserts Chocolate and cocoa. Donuts. Peppermint and spearmint. Fats and Oils High-fat foods, including [place] fries and potato chips. Other Vinegar. Strong spices, such as black pepper, white pepper, red pepper, cayenne, curry powder, cloves, ginger, and chili powder. The items listed above may not be a complete list of foods and beverages to avoid. Contact your dietitian for  more information. Document Released: 02/22/2005 Document Revised: 02/27/2013 Document Reviewed: 12/27/2012 [facility] Patient Information 2015 [place], [place]. This information is not intended to replace advice given to you by your health care provider. Make sure you discuss any questions you have with your health care provider.

## 2014-06-03 NOTE — Assessment & Plan Note (Signed)
Symptoms resolved on Omeprazole 20 mg daily. Will provide Rx for this as previous visit only provided with #30 R1. Return for follow-up prn worsening symptoms or other GI issues.

## 2014-06-11 ENCOUNTER — Other Ambulatory Visit (HOSPITAL_COMMUNITY)
Admission: RE | Admit: 2014-06-11 | Discharge: 2014-06-11 | Disposition: A | Discharge status: Home / Self Care | Payer: Medicaid Other | Source: Ambulatory Visit | Attending: Obstetrics & Gynecology | Admitting: Obstetrics & Gynecology

## 2014-06-11 ENCOUNTER — Encounter: Payer: Self-pay | Admitting: Obstetrics & Gynecology

## 2014-06-11 ENCOUNTER — Ambulatory Visit (INDEPENDENT_AMBULATORY_CARE_PROVIDER_SITE_OTHER): Payer: Medicaid Other | Admitting: Obstetrics & Gynecology

## 2014-06-11 VITALS — BP 128/90 | HR 80 | Wt 166.4 lb

## 2014-06-11 DIAGNOSIS — R896 Abnormal cytological findings in specimens from other organs, systems and tissues: Secondary | ICD-10-CM | POA: Diagnosis not present

## 2014-06-11 DIAGNOSIS — N939 Abnormal uterine and vaginal bleeding, unspecified: Secondary | ICD-10-CM

## 2014-06-11 DIAGNOSIS — IMO0002 Reserved for concepts with insufficient information to code with codable children: Secondary | ICD-10-CM

## 2014-06-11 DIAGNOSIS — Z01411 Encounter for gynecological examination (general) (routine) with abnormal findings: Secondary | ICD-10-CM | POA: Insufficient documentation

## 2014-06-11 MED ORDER — MEGESTROL ACETATE 40 MG PO TABS: ORAL_TABLET | ORAL | Status: DC

## 2014-06-11 NOTE — Progress Notes (Signed)
Patient ID: Madison Pratt, female   DOB: Jun 14, 1990, 24 y.o.   MRN: [id number] Pt had a Pap and colposcopy 6 months ago  Chief Complaint  Patient presents with  . Follow-up    6 month pap.   Blood pressure 128/90, pulse 80, weight 166 lb 6.4 oz (75.479 kg), last menstrual period 05/13/2014.  Madison Pratt is seen for a increased interval cytologic evaluation of the cervix She had a Pap smear which revealed atypical squamous cells cannot rule out high-grade dysplasia and subsequently a colposcopy 6 months ago The colposcopy was normal We are repeating the Pap smear today She is without other complaints  Normal external female genitalia Vagina pink moist no discharge Cervix no lesions Pap is obtained  Follow-up based on results of Pap Patient aware she may need another colposcopy

## 2014-06-13 LAB — CYTOLOGY - PAP

## 2014-06-18 ENCOUNTER — Telehealth: Payer: Self-pay | Admitting: *Deleted

## 2014-06-18 NOTE — Telephone Encounter (Signed)
noted 

## 2014-06-18 NOTE — Telephone Encounter (Signed)
Pt requesting results of pap from 06/11/2014. Pt informed of abnormal pap, LSIL, call transferred to front staff for colposcopy to be scheduled. Procedure explained and pt verbalized understanding.

## 2014-06-28 ENCOUNTER — Ambulatory Visit (INDEPENDENT_AMBULATORY_CARE_PROVIDER_SITE_OTHER): Payer: Medicaid Other | Admitting: Obstetrics & Gynecology

## 2014-06-28 ENCOUNTER — Encounter: Payer: Self-pay | Admitting: Obstetrics & Gynecology

## 2014-06-28 VITALS — BP 110/70 | HR 76 | Ht 65.0 in | Wt 163.0 lb

## 2014-06-28 DIAGNOSIS — N870 Mild cervical dysplasia: Secondary | ICD-10-CM

## 2014-06-28 NOTE — Progress Notes (Signed)
Patient ID: Madison Pratt, female   DOB: June 30, 1990, 24 y.o.   MRN: [id number] Colposcopy Procedure Note  Indications: Pap smear 1 months ago showed: low-grade squamous intraepithelial neoplasia (LGSIL - encompassing HPV,mild dysplasia,CIN I). The prior pap showed ASCUS with POSITIVE high risk HPV.  Prior cervical/vaginal disease: normal exam without visible pathology. Prior cervical treatment: no treatment.  Procedure Details  The risks and benefits of the procedure and Written informed consent obtained.  Speculum placed in vagina and excellent visualization of cervix achieved, cervix swabbed x 3 with acetic acid solution.  Findings: Cervix: no visible lesions, no mosaicism, no punctation, no abnormal vasculature and HPV changes noted at minor o'clock; no biopsies taken. Vaginal inspection: normal without visible lesions. Vulvar colposcopy: vulvar colposcopy not performed.  Specimens: none  Complications: none.  Plan: Follow up Pap in 1 year

## 2015-03-09 ENCOUNTER — Encounter (HOSPITAL_COMMUNITY): Payer: Self-pay | Admitting: Emergency Medicine

## 2015-03-09 DIAGNOSIS — Z79899 Other long term (current) drug therapy: Secondary | ICD-10-CM | POA: Diagnosis not present

## 2015-03-09 DIAGNOSIS — G8929 Other chronic pain: Secondary | ICD-10-CM | POA: Insufficient documentation

## 2015-03-09 DIAGNOSIS — Z9889 Other specified postprocedural states: Secondary | ICD-10-CM | POA: Diagnosis not present

## 2015-03-09 DIAGNOSIS — R109 Unspecified abdominal pain: Secondary | ICD-10-CM | POA: Diagnosis not present

## 2015-03-09 DIAGNOSIS — R079 Chest pain, unspecified: Secondary | ICD-10-CM | POA: Diagnosis not present

## 2015-03-09 DIAGNOSIS — Z87891 Personal history of nicotine dependence: Secondary | ICD-10-CM | POA: Insufficient documentation

## 2015-03-09 DIAGNOSIS — R002 Palpitations: Secondary | ICD-10-CM | POA: Insufficient documentation

## 2015-03-09 DIAGNOSIS — R5383 Other fatigue: Secondary | ICD-10-CM | POA: Insufficient documentation

## 2015-03-09 DIAGNOSIS — R030 Elevated blood-pressure reading, without diagnosis of hypertension: Secondary | ICD-10-CM | POA: Diagnosis present

## 2015-03-09 NOTE — ED Notes (Signed)
Pt states she has was at work when she started running an elevated BP. States "many people at work have died d/t stress at her job".

## 2015-03-10 ENCOUNTER — Emergency Department (HOSPITAL_COMMUNITY)
Admission: EM | Admit: 2015-03-10 | Discharge: 2015-03-10 | Disposition: A | Discharge status: Home / Self Care | Payer: Medicaid Other | Attending: Emergency Medicine | Admitting: Emergency Medicine

## 2015-03-10 DIAGNOSIS — R5383 Other fatigue: Secondary | ICD-10-CM

## 2015-03-10 NOTE — ED Provider Notes (Signed)
CSN: [id number]     Arrival date & time 03/09/15  2320 History  By signing my name below, I, Budd PalmerVanessa Prueter, attest that this documentation has been prepared under the direction and in the presence of Zadie Rhineonald Yannick Steuber, MD. Electronically Signed: Budd PalmerVanessa Prueter, ED Scribe. 03/10/2015. 12:43 AM.    Chief Complaint  Patient presents with  . Hypertension   Patient is a 25 y.o. female presenting with hypertension. The history is provided by the patient. No language interpreter was used.  Hypertension This is a recurrent problem. The current episode started 1 to 2 hours ago. The problem occurs daily. The problem has been gradually worsening. Associated symptoms include chest pain and abdominal pain. Pertinent negatives include no shortness of breath. The symptoms are aggravated by stress. The symptoms are relieved by relaxation and rest. She has tried rest for the symptoms.   HPI Comments: Madison Pratt is a 25 y.o. female former smoker who presents to the Emergency Department complaining of intermittent hypertension onset 4 days ago, with the most recent episode occuring just PTA. Pt states she is very stressed at work and that this is when her blood pressure has been spiking for the past 4 nights. She reports associated mild chest pain, palpitations, and abdominal pain. She notes that after she leaves work, her BP drops back down to normal. She notes she "has been having problems with [her] blood pressure" since having a C-section. She states she was placed on medication for this for a time. Pt denies SOB, n/v/d, tingling, and numbness.   Past Medical History  Diagnosis Date  . Chronic abdominal pain    Past Surgical History  Procedure Laterality Date  . Cranial surgery    . Cesarean section  09/2013  . Tonsillectomy  2001   Family History  Problem Relation Age of Onset  . Colon cancer Neg Hx   . Ovarian cancer Mother    Social History  Substance Use Topics  . Smoking status: Former Smoker     Quit date: 09/05/2012  . Smokeless tobacco: Never Used  . Alcohol Use: No     Comment: quit drinking 2014; Previously had 1 beer/day   OB History    No data available     Review of Systems  Respiratory: Negative for shortness of breath.   Cardiovascular: Positive for chest pain and palpitations.  Gastrointestinal: Positive for abdominal pain. Negative for nausea, vomiting and diarrhea.  Neurological: Negative for numbness.  All other systems reviewed and are negative.   Allergies  Review of patient's allergies indicates no known allergies.  Home Medications   Prior to Admission medications   Medication Sig Start Date End Date Taking? Authorizing Provider  etonogestrel (NEXPLANON) 68 MG IMPL implant 1 each by Subdermal route once.    Historical Provider, MD  ibuprofen (ADVIL,MOTRIN) 200 MG tablet Take 600 mg by mouth every 6 (six) hours as needed. For headaches/fever    Historical Provider, MD  megestrol (MEGACE) 40 MG tablet 3 tablets a day for 5 days, 2 tablets a day for 5 days then 1 tablet daily Patient not taking: Reported on 06/28/2014 06/11/14   Lazaro ArmsLuther H Eure, MD  omeprazole (PRILOSEC) 20 MG capsule Take 1 capsule (20 mg total) by mouth daily. 06/03/14   Anice PaganiniEric A Gill, NP   BP 122/74 mmHg  Pulse 76  Temp(Src) 97.6 F (36.4 C) (Oral)  Resp 20  Ht 5\' 5"  (1.651 m)  Wt 175 lb (79.379 kg)  BMI 29.12 kg/m2  SpO2 99%  LMP 02/06/2015 Physical Exam CONSTITUTIONAL: Well developed/well nourished HEAD: Normocephalic/atraumatic EYES: EOMI/PERRL ENMT: Mucous membranes moist NECK: supple no meningeal signs SPINE/BACK:entire spine nontender CV: S1/S2 noted, no murmurs/rubs/gallops noted LUNGS: Lungs are clear to auscultation bilaterally, no apparent distress ABDOMEN: soft, nontender, no rebound or guarding, bowel sounds noted throughout abdomen GU:no cva tenderness NEURO: Pt is awake/alert/appropriate, moves all extremitiesx4.  No facial droop, no arm or leg drift, no ataxia    EXTREMITIES: pulses normal/equal, full ROM SKIN: warm, color normal PSYCH: mildly anxious  ED Course  Procedures  DIAGNOSTIC STUDIES: Oxygen Saturation is 100% on RA, normal by my interpretation.    COORDINATION OF CARE: 12:38 AM - Discussed probable situational issue. Discussed plans to discharge. Advised pt to check her BP once a week and f/u with her PCP if the problem persists. Pt advised of plan for treatment and pt agrees. I offered EKG but patient declined and she preferred to be discharged    MDM   Final diagnoses:  Other fatigue    Nursing notes including past medical history and social history reviewed and considered in documentation   I personally performed the services described in this documentation, which was scribed in my presence. The recorded information has been reviewed and is accurate.       Zadie Rhine, MD 03/10/15 [phone]

## 2015-03-10 NOTE — Discharge Instructions (Signed)

## 2015-04-19 ENCOUNTER — Emergency Department (HOSPITAL_COMMUNITY): Payer: Medicaid Other

## 2015-04-19 ENCOUNTER — Encounter (HOSPITAL_COMMUNITY): Payer: Self-pay | Admitting: Emergency Medicine

## 2015-04-19 ENCOUNTER — Emergency Department (HOSPITAL_COMMUNITY)
Admission: EM | Admit: 2015-04-19 | Discharge: 2015-04-19 | Disposition: A | Discharge status: Home / Self Care | Payer: Medicaid Other | Attending: Emergency Medicine | Admitting: Emergency Medicine

## 2015-04-19 DIAGNOSIS — J209 Acute bronchitis, unspecified: Secondary | ICD-10-CM

## 2015-04-19 DIAGNOSIS — Z87891 Personal history of nicotine dependence: Secondary | ICD-10-CM | POA: Diagnosis not present

## 2015-04-19 DIAGNOSIS — R112 Nausea with vomiting, unspecified: Secondary | ICD-10-CM | POA: Insufficient documentation

## 2015-04-19 DIAGNOSIS — Z79899 Other long term (current) drug therapy: Secondary | ICD-10-CM | POA: Insufficient documentation

## 2015-04-19 DIAGNOSIS — R05 Cough: Secondary | ICD-10-CM

## 2015-04-19 DIAGNOSIS — R000 Tachycardia, unspecified: Secondary | ICD-10-CM | POA: Diagnosis not present

## 2015-04-19 DIAGNOSIS — G8929 Other chronic pain: Secondary | ICD-10-CM | POA: Insufficient documentation

## 2015-04-19 DIAGNOSIS — R059 Cough, unspecified: Secondary | ICD-10-CM

## 2015-04-19 MED ORDER — HYDROCOD POLST-CPM POLST ER 10-8 MG/5ML PO SUER
5.0000 mL | Freq: Once | ORAL | Status: AC
  Administered 2015-04-19: 5 mL via ORAL
  Filled 2015-04-19: qty 5

## 2015-04-19 MED ORDER — BENZONATATE 100 MG PO CAPS
100.0000 mg | ORAL_CAPSULE | Freq: Once | ORAL | Status: AC
  Administered 2015-04-19: 100 mg via ORAL
  Filled 2015-04-19: qty 1

## 2015-04-19 MED ORDER — ONDANSETRON 4 MG PO TBDP: 4.0000 mg | ORAL_TABLET | Freq: Three times a day (TID) | ORAL | Status: DC | PRN

## 2015-04-19 MED ORDER — BENZONATATE 100 MG PO CAPS: 100.0000 mg | ORAL_CAPSULE | Freq: Three times a day (TID) | ORAL | Status: DC

## 2015-04-19 MED ORDER — GUAIFENESIN-CODEINE 100-10 MG/5ML PO SYRP: 5.0000 mL | ORAL_SOLUTION | Freq: Three times a day (TID) | ORAL | Status: DC | PRN

## 2015-04-19 MED ORDER — ONDANSETRON 4 MG PO TBDP
4.0000 mg | ORAL_TABLET | Freq: Once | ORAL | Status: AC
  Administered 2015-04-19: 4 mg via ORAL
  Filled 2015-04-19: qty 1

## 2015-04-19 NOTE — ED Notes (Signed)
Pt c/o cold sx x 1 week with productive cough-green sputum. Denies fever. nad noted.

## 2015-04-19 NOTE — ED Provider Notes (Signed)
CSN: [id number]     Arrival date & time 04/19/15  4098 History  By signing my name below, I, Ronney Lion, attest that this documentation has been prepared under the direction and in the presence of Rolland Porter, MD. Electronically Signed: Ronney Lion, ED Scribe. 04/19/2015. 11:05 AM.    Chief Complaint  Patient presents with  . Cough   The history is provided by the patient. No language interpreter was used.   HPI Comments: Madison Pratt is a 25 y.o. female who presents to the Emergency Department complaining of a gradual-onset, constant, moderate, worsening cough, occasionally productive with green sputum, that began 1 week ago. She also complains of associated headache, sinus congestion, generalized myalgias, subjective fever and chills, nausea, and post-tussive vomiting. She states her headache is exacerbated by her frequent coughing. She denies a history of any pulmonary conditions. She also denies a history of smoking.   Past Medical History  Diagnosis Date  . Chronic abdominal pain    Past Surgical History  Procedure Laterality Date  . Cranial surgery    . Cesarean section  09/2013  . Tonsillectomy  2001   Family History  Problem Relation Age of Onset  . Colon cancer Neg Hx   . Ovarian cancer Mother    Social History  Substance Use Topics  . Smoking status: Former Smoker    Quit date: 09/05/2012  . Smokeless tobacco: Never Used  . Alcohol Use: No     Comment: quit drinking 2014; Previously had 1 beer/day   OB History    No data available     Review of Systems  Constitutional: Positive for fever (subjective) and chills. Negative for appetite change and fatigue.  HENT: Positive for sinus pressure. Negative for mouth sores and trouble swallowing.   Eyes: Negative for visual disturbance.  Respiratory: Positive for cough. Negative for chest tightness.   Gastrointestinal: Positive for nausea and vomiting (post-tussive). Negative for abdominal pain, diarrhea and abdominal  distention.  Endocrine: Negative for polydipsia, polyphagia and polyuria.  Genitourinary: Negative for dysuria, frequency and hematuria.  Musculoskeletal: Positive for myalgias. Negative for gait problem.  Skin: Negative for color change and pallor.  Neurological: Positive for headaches. Negative for dizziness, syncope and light-headedness.  Hematological: Does not bruise/bleed easily.  Psychiatric/Behavioral: Negative for behavioral problems and confusion.      Allergies  Review of patient's allergies indicates no known allergies.  Home Medications   Prior to Admission medications   Medication Sig Start Date End Date Taking? Authorizing Provider  benzonatate (TESSALON) 100 MG capsule Take 1 capsule (100 mg total) by mouth every 8 (eight) hours. 04/19/15   Rolland Porter, MD  etonogestrel (NEXPLANON) 68 MG IMPL implant 1 each by Subdermal route once.    Historical Provider, MD  guaiFENesin-codeine (CHERATUSSIN AC) 100-10 MG/5ML syrup Take 5 mLs by mouth 3 (three) times daily as needed for cough. 04/19/15   Rolland Porter, MD  ibuprofen (ADVIL,MOTRIN) 200 MG tablet Take 600 mg by mouth every 6 (six) hours as needed. For headaches/fever    Historical Provider, MD  megestrol (MEGACE) 40 MG tablet 3 tablets a day for 5 days, 2 tablets a day for 5 days then 1 tablet daily Patient not taking: Reported on 06/28/2014 06/11/14   Lazaro Arms, MD  omeprazole (PRILOSEC) 20 MG capsule Take 1 capsule (20 mg total) by mouth daily. 06/03/14   Anice Paganini, NP  ondansetron (ZOFRAN ODT) 4 MG disintegrating tablet Take 1 tablet (4 mg total) by  mouth every 8 (eight) hours as needed for nausea. 04/19/15   Rolland Porter, MD   BP 125/76 mmHg  Pulse 94  Temp(Src) 98.8 F (37.1 C) (Oral)  Resp 16  Ht  (1.651 m)  Wt 180 lb (81.647 kg)  BMI 29.95 kg/m2  SpO2 100%  LMP 04/09/2015 Physical Exam  Constitutional: She is oriented to person, place, and time. She appears well-developed and well-nourished. No distress.   HENT:  Head: Normocephalic.  Eyes: Conjunctivae are normal. Pupils are equal, round, and reactive to light. No scleral icterus.  Neck: Normal range of motion. Neck supple. No thyromegaly present.  Cardiovascular: Regular rhythm.  Tachycardia present.  Exam reveals no gallop and no friction rub.   No murmur heard. Sinus tachycardia, rate 110.  Pulmonary/Chest: Effort normal and breath sounds normal. No respiratory distress. She has no wheezes. She has no rales.  Lungs are clear to auscultation bilaterally.  Abdominal: Soft. Bowel sounds are normal. She exhibits no distension. There is no tenderness. There is no rebound.  Musculoskeletal: Normal range of motion.  Neurological: She is alert and oriented to person, place, and time.  Skin: Skin is warm and dry. No rash noted.  Psychiatric: She has a normal mood and affect. Her behavior is normal.  Nursing note and vitals reviewed.   ED Course  Procedures (including critical care time)  DIAGNOSTIC STUDIES: Oxygen Saturation is 100% on RA, normal by my interpretation.    COORDINATION OF CARE: 9:28 AM - Discussed treatment plan with pt at bedside which includes CXR to r/o pneumonia. Pt verbalized understanding and agreed to plan.   Imaging Review Dg Chest 2 View  04/19/2015  CLINICAL DATA:  Seven day history of cough and shortness of breath; fever EXAM: CHEST  2 VIEW COMPARISON:  February 29, 2012 FINDINGS: Lungs are clear. Heart size and pulmonary vascularity are normal. No adenopathy. No bone lesions. IMPRESSION: No edema or consolidation. Electronically Signed   By: Bretta Bang III M.D.   On: 04/19/2015 10:03   I have personally reviewed and evaluated these images and lab results as part of my medical decision-making. MDM   Final diagnoses:  Cough  Acute bronchitis, unspecified organism   Normal x-ray. Patient not febrile or hypoxemic. Likely viral syndrome. Plan is symptomatically treatment.    Rolland Porter, MD 04/19/15  1105

## 2015-04-19 NOTE — Discharge Instructions (Signed)
Acute Bronchitis °Bronchitis is inflammation of the airways that extend from the windpipe into the lungs (bronchi). The inflammation often causes mucus to develop. This leads to a cough, which is the most common symptom of bronchitis.  °In acute bronchitis, the condition usually develops suddenly and goes away over time, usually in a couple weeks. Smoking, allergies, and asthma can make bronchitis worse. Repeated episodes of bronchitis may cause further lung problems.  °CAUSES °Acute bronchitis is most often caused by the same virus that causes a cold. The virus can spread from person to person (contagious) through coughing, sneezing, and touching contaminated objects. °SIGNS AND SYMPTOMS  °· Cough.   °· Fever.   °· Coughing up mucus.   °· Body aches.   °· Chest congestion.   °· Chills.   °· Shortness of breath.   °· Sore throat.   °DIAGNOSIS  °Acute bronchitis is usually diagnosed through a physical exam. Your health care provider will also ask you questions about your medical history. Tests, such as chest X-rays, are sometimes done to rule out other conditions.  °TREATMENT  °Acute bronchitis usually goes away in a couple weeks. Oftentimes, no medical treatment is necessary. Medicines are sometimes given for relief of fever or cough. Antibiotic medicines are usually not needed but may be prescribed in certain situations. In some cases, an inhaler may be recommended to help reduce shortness of breath and control the cough. A cool mist vaporizer may also be used to help thin bronchial secretions and make it easier to clear the chest.  °HOME CARE INSTRUCTIONS °· Get plenty of rest.   °· Drink enough fluids to keep your urine clear or pale yellow (unless you have a medical condition that requires fluid restriction). Increasing fluids may help thin your respiratory secretions (sputum) and reduce chest congestion, and it will prevent dehydration.   °· Take medicines only as directed by your health care provider. °· If  you were prescribed an antibiotic medicine, finish it all even if you start to feel better. °· Avoid smoking and secondhand smoke. Exposure to cigarette smoke or irritating chemicals will make bronchitis worse. If you are a smoker, consider using nicotine gum or skin patches to help control withdrawal symptoms. Quitting smoking will help your lungs heal faster.   °· Reduce the chances of another bout of acute bronchitis by washing your hands frequently, avoiding people with cold symptoms, and trying not to touch your hands to your mouth, nose, or eyes.   °· Keep all follow-up visits as directed by your health care provider.   °SEEK MEDICAL CARE IF: °Your symptoms do not improve after 1 week of treatment.  °SEEK IMMEDIATE MEDICAL CARE IF: °· You develop an increased fever or chills.   °· You have chest pain.   °· You have severe shortness of breath. °· You have bloody sputum.   °· You develop dehydration. °· You faint or repeatedly feel like you are going to pass out. °· You develop repeated vomiting. °· You develop a severe headache. °MAKE SURE YOU:  °· Understand these instructions. °· Will watch your condition. °· Will get help right away if you are not doing well or get worse. °  °This information is not intended to replace advice given to you by your health care provider. Make sure you discuss any questions you have with your health care provider. °  °Document Released: 04/01/2004 Document Revised: 03/15/2014 Document Reviewed: 08/15/2012 °Elsevier Interactive Patient Education ©2016 [employer] ° °Cough, Adult °Coughing is a reflex that clears your throat and your airways. Coughing helps to heal and   protect your lungs. It is normal to cough occasionally, but a cough that happens with other symptoms or lasts a long time may be a sign of a condition that needs treatment. A cough may last only 2-3 weeks (acute), or it may last longer than 8 weeks (chronic). °CAUSES °Coughing is commonly caused by: °· Breathing  in substances that irritate your lungs. °· A viral or bacterial respiratory infection. °· Allergies. °· Asthma. °· Postnasal drip. °· Smoking. °· Acid backing up from the stomach into the esophagus (gastroesophageal reflux). °· Certain medicines. °· Chronic lung problems, including COPD (or rarely, lung cancer). °· Other medical conditions such as heart failure. °HOME CARE INSTRUCTIONS  °Pay attention to any changes in your symptoms. Take these actions to help with your discomfort: °· Take medicines only as told by your health care provider. °¨ If you were prescribed an antibiotic medicine, take it as told by your health care provider. Do not stop taking the antibiotic even if you start to feel better. °¨ Talk with your health care provider before you take a cough suppressant medicine. °· Drink enough fluid to keep your urine clear or pale yellow. °· If the air is dry, use a cold steam vaporizer or humidifier in your bedroom or your home to help loosen secretions. °· Avoid anything that causes you to cough at work or at home. °· If your cough is worse at night, try sleeping in a semi-upright position. °· Avoid cigarette smoke. If you smoke, quit smoking. If you need help quitting, ask your health care provider. °· Avoid caffeine. °· Avoid alcohol. °· Rest as needed. °SEEK MEDICAL CARE IF:  °· You have new symptoms. °· You cough up pus. °· Your cough does not get better after 2-3 weeks, or your cough gets worse. °· You cannot control your cough with suppressant medicines and you are losing sleep. °· You develop pain that is getting worse or pain that is not controlled with pain medicines. °· You have a fever. °· You have unexplained weight loss. °· You have night sweats. °SEEK IMMEDIATE MEDICAL CARE IF: °· You cough up blood. °· You have difficulty breathing. °· Your heartbeat is very fast. °  °This information is not intended to replace advice given to you by your health care provider. Make sure you discuss any  questions you have with your health care provider. °  °Document Released: 08/21/2010 Document Revised: 11/13/2014 Document Reviewed: 05/01/2014 °Elsevier Interactive Patient Education ©2016 [employer] Inc. ° °

## 2015-04-19 NOTE — ED Notes (Signed)
Pt states that she has had a lot of sinus congestion with a cough for the past week.

## 2016-11-19 ENCOUNTER — Encounter (HOSPITAL_COMMUNITY): Payer: Self-pay

## 2016-11-19 ENCOUNTER — Emergency Department (HOSPITAL_COMMUNITY)
Admission: EM | Admit: 2016-11-19 | Discharge: 2016-11-19 | Disposition: A | Discharge status: Home / Self Care | Payer: Medicaid Other | Attending: Emergency Medicine | Admitting: Emergency Medicine

## 2016-11-19 DIAGNOSIS — Z87891 Personal history of nicotine dependence: Secondary | ICD-10-CM | POA: Insufficient documentation

## 2016-11-19 DIAGNOSIS — S40022A Contusion of left upper arm, initial encounter: Secondary | ICD-10-CM | POA: Diagnosis not present

## 2016-11-19 DIAGNOSIS — S1017XA Other superficial bite of throat, initial encounter: Secondary | ICD-10-CM | POA: Diagnosis present

## 2016-11-19 DIAGNOSIS — Y929 Unspecified place or not applicable: Secondary | ICD-10-CM | POA: Insufficient documentation

## 2016-11-19 DIAGNOSIS — Y998 Other external cause status: Secondary | ICD-10-CM | POA: Insufficient documentation

## 2016-11-19 DIAGNOSIS — Z79899 Other long term (current) drug therapy: Secondary | ICD-10-CM | POA: Diagnosis not present

## 2016-11-19 DIAGNOSIS — S100XXA Contusion of throat, initial encounter: Secondary | ICD-10-CM | POA: Diagnosis not present

## 2016-11-19 DIAGNOSIS — S20219A Contusion of unspecified front wall of thorax, initial encounter: Secondary | ICD-10-CM | POA: Insufficient documentation

## 2016-11-19 DIAGNOSIS — Y939 Activity, unspecified: Secondary | ICD-10-CM | POA: Diagnosis not present

## 2016-11-19 MED ORDER — IBUPROFEN 800 MG PO TABS
800.0000 mg | ORAL_TABLET | Freq: Once | ORAL | Status: AC
  Administered 2016-11-19: 800 mg via ORAL
  Filled 2016-11-19: qty 1

## 2016-11-19 MED ORDER — ACETAMINOPHEN 325 MG PO TABS
650.0000 mg | ORAL_TABLET | Freq: Once | ORAL | Status: AC
  Administered 2016-11-19: 650 mg via ORAL
  Filled 2016-11-19: qty 2
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
## 2016-11-19 NOTE — Discharge Instructions (Signed)
Take ibuprofen and/or acetaminophen as needed for pain. 
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
## 2020-04-04 ENCOUNTER — Emergency Department (HOSPITAL_COMMUNITY)
Admission: EM | Admit: 2020-04-04 | Discharge: 2020-04-04 | Disposition: A | Discharge status: Home / Self Care | Payer: Medicaid Other | Attending: Emergency Medicine | Admitting: Emergency Medicine

## 2020-04-04 ENCOUNTER — Encounter (HOSPITAL_COMMUNITY): Payer: Self-pay | Admitting: Emergency Medicine

## 2020-04-04 ENCOUNTER — Other Ambulatory Visit: Payer: Self-pay

## 2020-04-04 DIAGNOSIS — N939 Abnormal uterine and vaginal bleeding, unspecified: Secondary | ICD-10-CM | POA: Insufficient documentation

## 2020-04-04 DIAGNOSIS — Z5321 Procedure and treatment not carried out due to patient leaving prior to being seen by health care provider: Secondary | ICD-10-CM | POA: Diagnosis not present

## 2020-04-04 DIAGNOSIS — I10 Essential (primary) hypertension: Secondary | ICD-10-CM | POA: Diagnosis not present

## 2020-04-04 NOTE — ED Triage Notes (Addendum)
Patient c/o hypertension with headache. Per patient recently moved and has not had thyroid medication in 3 weeks. Recently went to a PCP and had blood work done, patient told protein was low in blood and high blood pressure, has another appointment on 2/23-patient not given another prescription for thyroid medication or anything for the new onset of hypertension Patient was told need more blood work first and advised to come to ED. Pateint also reports vaginal bleeding x3 weeks since having first Covid vaccination.

## 2021-01-17 ENCOUNTER — Emergency Department (HOSPITAL_COMMUNITY)
Admission: EM | Admit: 2021-01-17 | Discharge: 2021-01-17 | Disposition: A | Discharge status: Home / Self Care | Payer: Medicaid Other | Attending: Medical | Admitting: Medical

## 2021-01-17 ENCOUNTER — Emergency Department (HOSPITAL_COMMUNITY): Payer: Medicaid Other

## 2021-01-17 ENCOUNTER — Other Ambulatory Visit: Payer: Self-pay

## 2021-01-17 DIAGNOSIS — Y9389 Activity, other specified: Secondary | ICD-10-CM | POA: Insufficient documentation

## 2021-01-17 DIAGNOSIS — S8991XA Unspecified injury of right lower leg, initial encounter: Secondary | ICD-10-CM | POA: Diagnosis not present

## 2021-01-17 DIAGNOSIS — Z5321 Procedure and treatment not carried out due to patient leaving prior to being seen by health care provider: Secondary | ICD-10-CM | POA: Insufficient documentation

## 2021-01-17 DIAGNOSIS — Y92410 Unspecified street and highway as the place of occurrence of the external cause: Secondary | ICD-10-CM | POA: Diagnosis not present

## 2021-01-17 DIAGNOSIS — S069X1A Unspecified intracranial injury with loss of consciousness of 30 minutes or less, initial encounter: Secondary | ICD-10-CM | POA: Insufficient documentation

## 2021-01-17 MED ORDER — ACETAMINOPHEN 500 MG PO TABS
1000.0000 mg | ORAL_TABLET | Freq: Once | ORAL | Status: AC
  Administered 2021-01-17: 1000 mg via ORAL
  Filled 2021-01-17: qty 2
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
## 2021-07-07 ENCOUNTER — Other Ambulatory Visit: Payer: Self-pay | Admitting: Psychiatry

## 2021-07-07 DIAGNOSIS — F0781 Postconcussional syndrome: Secondary | ICD-10-CM

## 2021-08-04 ENCOUNTER — Other Ambulatory Visit: Payer: Medicaid Other
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
## 2022-07-28 ENCOUNTER — Ambulatory Visit: Payer: Medicaid Other | Attending: Family Medicine

## 2022-07-28 DIAGNOSIS — M546 Pain in thoracic spine: Secondary | ICD-10-CM | POA: Diagnosis present

## 2022-07-28 DIAGNOSIS — M5459 Other low back pain: Secondary | ICD-10-CM | POA: Insufficient documentation
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
## 2022-08-04 ENCOUNTER — Telehealth: Payer: Self-pay

## 2022-08-04 ENCOUNTER — Ambulatory Visit: Payer: Medicaid Other | Attending: Rheumatology | Admitting: Rheumatology

## 2022-08-04 ENCOUNTER — Ambulatory Visit: Payer: Medicaid Other

## 2022-08-04 ENCOUNTER — Encounter: Payer: Self-pay | Admitting: Rheumatology

## 2022-08-04 VITALS — BP 127/88 | HR 92 | Resp 16 | Ht 66.0 in | Wt 210.0 lb

## 2022-08-04 DIAGNOSIS — R768 Other specified abnormal immunological findings in serum: Secondary | ICD-10-CM | POA: Diagnosis not present

## 2022-08-04 DIAGNOSIS — Z8639 Personal history of other endocrine, nutritional and metabolic disease: Secondary | ICD-10-CM | POA: Diagnosis not present

## 2022-08-04 DIAGNOSIS — R1084 Generalized abdominal pain: Secondary | ICD-10-CM

## 2022-08-04 DIAGNOSIS — M797 Fibromyalgia: Secondary | ICD-10-CM

## 2022-08-04 DIAGNOSIS — R1013 Epigastric pain: Secondary | ICD-10-CM

## 2022-08-04 DIAGNOSIS — R569 Unspecified convulsions: Secondary | ICD-10-CM

## 2022-08-04 DIAGNOSIS — M546 Pain in thoracic spine: Secondary | ICD-10-CM

## 2022-08-04 DIAGNOSIS — R87611 Atypical squamous cells cannot exclude high grade squamous intraepithelial lesion on cytologic smear of cervix (ASC-H): Secondary | ICD-10-CM

## 2022-08-04 DIAGNOSIS — M542 Cervicalgia: Secondary | ICD-10-CM

## 2022-08-04 NOTE — Telephone Encounter (Signed)
No show. Called pt who said that today's appointment was supposed to be cancelled due to her Rheumatology appointment. Unable to make it to the later opening today due to having to pick up her daughter. Will be able to make it to her Friday appointment.

## 2022-08-05 LAB — C3 AND C4: C3 Complement: 155 mg/dL (ref 83–193)

## 2022-08-06 ENCOUNTER — Ambulatory Visit: Payer: Medicaid Other

## 2022-08-06 DIAGNOSIS — M5459 Other low back pain: Secondary | ICD-10-CM

## 2022-08-06 DIAGNOSIS — M546 Pain in thoracic spine: Secondary | ICD-10-CM

## 2022-08-06 LAB — ANA: Anti Nuclear Antibody (ANA): POSITIVE — AB

## 2022-08-06 LAB — ANTI-NUCLEAR AB-TITER (ANA TITER)
ANA TITER: 1:320 {titer} — ABNORMAL HIGH
ANA Titer 1: 1:640 {titer} — ABNORMAL HIGH

## 2022-08-06 LAB — C3 AND C4: C4 Complement: 35 mg/dL (ref 15–57)

## 2022-08-06 LAB — THYROGLOBULIN ANTIBODY: Thyroglobulin Ab: <1 IU/mL (ref <=1)

## 2022-08-06 LAB — THYROID PEROXIDASE ANTIBODIES (TPO) (REFL): Thyroperoxidase Ab SerPl-aCnc: 109 IU/mL — ABNORMAL HIGH (ref <9)
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
## 2022-08-08 NOTE — Progress Notes (Signed)
Thyroperoxidase antibody positive, ANA 1: 640 nuclear homogeneous, complements normal, thyroglobulin antibody negative.  I will discuss results at the follow-up visit.

## 2022-08-09 ENCOUNTER — Ambulatory Visit: Payer: Medicaid Other | Attending: Family Medicine

## 2022-08-09 DIAGNOSIS — M546 Pain in thoracic spine: Secondary | ICD-10-CM | POA: Insufficient documentation

## 2022-08-09 DIAGNOSIS — M5459 Other low back pain: Secondary | ICD-10-CM | POA: Diagnosis present
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
## 2022-08-12 ENCOUNTER — Ambulatory Visit: Payer: Medicaid Other

## 2022-08-12 DIAGNOSIS — M546 Pain in thoracic spine: Secondary | ICD-10-CM | POA: Diagnosis not present

## 2022-08-12 DIAGNOSIS — M5459 Other low back pain: Secondary | ICD-10-CM
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
## 2022-08-18 ENCOUNTER — Ambulatory Visit: Payer: Medicaid Other

## 2022-08-18 DIAGNOSIS — M546 Pain in thoracic spine: Secondary | ICD-10-CM | POA: Diagnosis not present

## 2022-08-18 DIAGNOSIS — M5459 Other low back pain: Secondary | ICD-10-CM
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
## 2022-08-19 NOTE — Progress Notes (Deleted)
Office Visit Note  Patient: Madison Pratt             Date of Birth: Dec 11, 1990           MRN: [id number]             PCP: Alain Honey, FNP Referring: Alain Honey, FNP Visit Date: 09/02/2022 Occupation: @GUAROCC @  Subjective:  No chief complaint on file.   History of Present Illness: Madison Pratt is a 32 y.o. female ***     Activities of Daily Living:  Patient reports morning stiffness for *** {minute/hour:19697}.   Patient {ACTIONS;DENIES/REPORTS:21021675::"Denies"} nocturnal pain.  Difficulty dressing/grooming: {ACTIONS;DENIES/REPORTS:21021675::"Denies"} Difficulty climbing stairs: {ACTIONS;DENIES/REPORTS:21021675::"Denies"} Difficulty getting out of chair: {ACTIONS;DENIES/REPORTS:21021675::"Denies"} Difficulty using hands for taps, buttons, cutlery, and/or writing: {ACTIONS;DENIES/REPORTS:21021675::"Denies"}  No Rheumatology ROS completed.   PMFS History:  Patient Active Problem List   Diagnosis Date Noted   Generalized abdominal pain 04/24/2014   Dyspepsia 04/24/2014   Pap smear of cervix with ASCUS, cannot exclude HGSIL 12/11/2013    Past Medical History:  Diagnosis Date   Cerebral atrophy (HCC)    per patient   Chronic abdominal pain     Family History  Problem Relation Age of Onset   Stroke Mother    Stroke Father    Healthy Brother    Healthy Son    Healthy Daughter    Colon cancer Neg Hx    Past Surgical History:  Procedure Laterality Date   CESAREAN SECTION     09/05/2013, 2020   cranial surgery     LEEP     2016?   TONSILLECTOMY  03/09/1999   Social History   Social History Narrative   Not on file   There is no immunization history for the selected administration types on file for this patient.   Objective: Vital Signs: There were no vitals taken for this visit.   Physical Exam   Musculoskeletal Exam: ***  CDAI Exam: CDAI Score: -- Patient Global: --; Provider Global: -- Swollen: --; Tender: -- Joint Exam 09/02/2022    No joint exam has been documented for this visit   There is currently no information documented on the homunculus. Go to the Rheumatology activity and complete the homunculus joint exam.  Investigation: No additional findings.  Imaging: No results found.  Recent Labs: Lab Results  Component Value Date   WBC 5.9 07/25/2009   HGB 11.9 (L) 07/25/2009   PLT 202 07/25/2009   NA 139 07/25/2009   K 3.6 07/25/2009   CL 107 07/25/2009   CO2 27 07/25/2009   GLUCOSE 84 07/25/2009   BUN 9 07/25/2009   CREATININE 0.51 07/25/2009   BILITOT 0.4 07/25/2009   ALKPHOS 49 07/25/2009   AST 22 07/25/2009   ALT 20 07/25/2009   PROT 6.8 07/25/2009   ALBUMIN 4.1 07/25/2009   CALCIUM 9.3 07/25/2009   GFRAA  07/25/2009    >60        The eGFR has been calculated using the MDRD equation. This calculation has not been validated in all clinical situations. eGFR's persistently <60 mL/min signify possible Chronic Kidney Disease.   Aug 04, 2022 ANA 1: 640 nuclear dots, 1: 320NH, C3-C4 normal, thyroglobulin antibody negative, TPO antibody 109  12/04/21:ANA 1:640NH, 1:1280nuclear, discrete nuclear dots, chromatin<1, Ro-, La-, CRP 1.4, RNP-, Scl-70-, ESR 2, Anti-Jo1-, smith-, centromere-, dsDNA 2   Speciality Comments: No specialty comments available.  Procedures:  No procedures performed Allergies: Patient has no known allergies.   Assessment / Plan:  Visit Diagnoses: No diagnosis found.  Orders: No orders of the defined types were placed in this encounter.  No orders of the defined types were placed in this encounter.   Face-to-face time spent with patient was *** minutes. Greater than 50% of time was spent in counseling and coordination of care.  Follow-Up Instructions: No follow-ups on file.   Bo Merino, MD  Note - This record has been created using Editor, commissioning.  Chart creation errors have been sought, but may not always  have been located. Such creation errors  do not reflect on  the standard of medical care.

## 2022-08-25 ENCOUNTER — Ambulatory Visit: Payer: Medicaid Other | Admitting: Rheumatology

## 2022-08-26 ENCOUNTER — Ambulatory Visit: Payer: Medicaid Other

## 2022-08-31 ENCOUNTER — Ambulatory Visit: Payer: Medicaid Other

## 2022-09-02 ENCOUNTER — Ambulatory Visit: Payer: Medicaid Other | Admitting: Rheumatology

## 2022-09-02 DIAGNOSIS — M546 Pain in thoracic spine: Secondary | ICD-10-CM

## 2022-09-02 DIAGNOSIS — R1013 Epigastric pain: Secondary | ICD-10-CM

## 2022-09-02 DIAGNOSIS — M542 Cervicalgia: Secondary | ICD-10-CM

## 2022-09-02 DIAGNOSIS — Z8639 Personal history of other endocrine, nutritional and metabolic disease: Secondary | ICD-10-CM

## 2022-09-02 DIAGNOSIS — R768 Other specified abnormal immunological findings in serum: Secondary | ICD-10-CM

## 2022-09-02 DIAGNOSIS — M797 Fibromyalgia: Secondary | ICD-10-CM

## 2022-09-02 DIAGNOSIS — R569 Unspecified convulsions: Secondary | ICD-10-CM

## 2022-09-02 DIAGNOSIS — R87611 Atypical squamous cells cannot exclude high grade squamous intraepithelial lesion on cytologic smear of cervix (ASC-H): Secondary | ICD-10-CM

## 2022-09-02 DIAGNOSIS — R1084 Generalized abdominal pain: Secondary | ICD-10-CM

## 2022-09-03 ENCOUNTER — Ambulatory Visit: Payer: Medicaid Other

## 2022-09-07 ENCOUNTER — Ambulatory Visit: Payer: Medicaid Other | Attending: Family Medicine

## 2022-09-07 ENCOUNTER — Telehealth: Payer: Self-pay

## 2022-09-07 NOTE — Telephone Encounter (Signed)
No show. Called patient and left a message pertaining to appointment and a reminder for the next follow up session. Return phone call requested. Phone number ([phone]) provided.   

## 2022-09-10 ENCOUNTER — Telehealth: Payer: Self-pay

## 2022-09-10 ENCOUNTER — Ambulatory Visit: Payer: Medicaid Other

## 2022-09-10 NOTE — Telephone Encounter (Signed)
Pt did not show for appointment. Author called pt at listed number, left HIPAA compliant VM. This is 2nd consecutive no-show visit (also missed 7/2).   9:31 AM, 09/10/22 Rosamaria Lints, PT, DPT Physical Therapist - [place] [phone] (Office)

## 2022-09-14 ENCOUNTER — Ambulatory Visit: Payer: Medicaid Other

## 2022-12-07 ENCOUNTER — Ambulatory Visit: Payer: Medicaid Other | Attending: Internal Medicine | Admitting: Internal Medicine

## 2022-12-07 ENCOUNTER — Encounter: Payer: Self-pay | Admitting: Internal Medicine

## 2022-12-07 VITALS — BP 144/110 | HR 88 | Ht 66.0 in | Wt 213.4 lb

## 2022-12-07 DIAGNOSIS — R079 Chest pain, unspecified: Secondary | ICD-10-CM | POA: Diagnosis not present

## 2022-12-07 DIAGNOSIS — R030 Elevated blood-pressure reading, without diagnosis of hypertension: Secondary | ICD-10-CM | POA: Insufficient documentation

## 2022-12-07 DIAGNOSIS — R0789 Other chest pain: Secondary | ICD-10-CM | POA: Insufficient documentation

## 2022-12-07 NOTE — Patient Instructions (Signed)
Medication Instructions:  Your physician recommends that you continue on your current medications as directed. Please refer to the Current Medication list given to you today.    Labwork: None  Testing/Procedures: None  Follow-Up: Your physician recommends that you schedule a follow-up appointment in: As needed.   Any Other Special Instructions Will Be Listed Below (If Applicable).     If you need a refill on your cardiac medications before your next appointment, please call your pharmacy.   

## 2022-12-07 NOTE — Progress Notes (Signed)
Cardiology Office Note  Date: 12/07/2022   ID: MARISKA DAFFIN, DOB [birth year], MRN [id number]  PCP:  Joana Reamer, DO  Cardiologist:  Marjo Bicker, MD Electrophysiologist:  None   History of Present Illness: Madison Pratt is a 32 y.o. female with no PMH was referred to cardiology clinic for evaluation of chest pain.  Chronic ongoing reproducible chest pains for the last 1 and half year, was told she had costochondritis and was taking ibuprofen since then with symptomatic improvement.  She also has chest pains with deep inspiration, pleuritic nature.  Occasionally with exertion but mostly with rest.  Echocardiogram in 8/24 showed normal LVEF, normal diastology and no valvular heart disease.  CT chest also reviewed, showed no evidence of coronary calcifications.  No pulmonary embolism or dissection noted.  She says she had scoliosis.  She also has SOB with activities in the last few months.  Has to take multiple breaks in performing household chores.  She was recently diagnosed with hypothyroidism, has fluctuations in weight gain.  No dizziness, presyncope, syncope or palpitations.  No leg swelling.   Past Medical History:  Diagnosis Date   Cerebral atrophy (HCC)    per patient   Chronic abdominal pain     Past Surgical History:  Procedure Laterality Date   CESAREAN SECTION     09/05/2013, 2020   cranial surgery     LEEP     2016?   TONSILLECTOMY  03/09/1999    Current Outpatient Medications  Medication Sig Dispense Refill   ibuprofen (ADVIL) 800 MG tablet Take 800 mg by mouth 3 (three) times daily.     levothyroxine (SYNTHROID) 50 MCG tablet TAKE ONE TABLET BY MOUTH ONCE DAILY ON AN EMPTY STOMACH for 90     traZODone (DESYREL) 50 MG tablet Take 50 mg by mouth at bedtime.     No current facility-administered medications for this visit.   Allergies:  Patient has no known allergies.   Social History: The patient  reports that she quit smoking about 10 years ago.  Her smoking use included cigarettes. She has never been exposed to tobacco smoke. She has never used smokeless tobacco. She reports that she does not drink alcohol and does not use drugs.   Family History: The patient's family history includes Healthy in her brother, daughter, and son; Stroke in her father and mother.   ROS:  Please see the history of present illness. Otherwise, complete review of systems is positive for none  All other systems are reviewed and negative.   Physical Exam: VS:  BP (!) 144/110 (BP Location: Left Arm, Cuff Size: Normal)   Pulse 88   Ht 5\' 6"  (1.676 m)   Wt 213 lb 6.4 oz (96.8 kg)   SpO2 98%   BMI 34.44 kg/m , BMI Body mass index is 34.44 kg/m.  Wt Readings from Last 3 Encounters:  12/07/22 213 lb 6.4 oz (96.8 kg)  08/04/22 210 lb (95.3 kg)  04/04/20 201 lb (91.2 kg)    General: Patient appears comfortable at rest. HEENT: Conjunctiva and lids normal, oropharynx clear with moist mucosa. Neck: Supple, no elevated JVP or carotid bruits, no thyromegaly. Lungs: Clear to auscultation, nonlabored breathing at rest. Cardiac: Regular rate and rhythm, no S3 or significant systolic murmur, no pericardial rub. Abdomen: Soft, nontender, no hepatomegaly, bowel sounds present, no guarding or rebound. Extremities: No pitting edema, distal pulses 2+. Skin: Warm and dry. Musculoskeletal: No kyphosis. Neuropsychiatric: Alert and oriented x3,  affect grossly appropriate.  Recent Labwork: No results found for requested labs within last 365 days.  No results found for: "CHOL", "TRIG", "HDL", "CHOLHDL", "VLDL", "LDLCALC", "LDLDIRECT"   Assessment and Plan:  Noncardiac chest pain: Patient has reproducible chest pain on palpation on the left side of her sternum and left breast for the last 1 and half year.  Symptomatic relief provided with ibuprofen.  She also has pleuritic chest pain (chest pain with deep breathing) and occasionally has chest pains with exertion but mostly  with rest.  I reviewed the CT chest on [facility] Everywhere that showed no evidence of coronary calcifications.  No CAD. Her BP is elevated today in the clinic, could be whitecoat HTN, instruct her to check her BPs daily at home which if elevated could be causing sharp chest pains.  She also has scoliosis which if severe can cause chest pain.  Instructed her to follow-up with PCP or spine doctor. Her BMI is 34.44, obese, exercise counseling provided which could help with improvement in her breathing.  Echocardiogram from 10/2022 showed normal LVEF, normal diastology, no valvular heart disease.  I provided reassurance that trivial TR and trivial PR is physiologically normal.  No workup indicated at this time from the cardiology perspective.  Elevated BP reading without diagnosis of HTN: Instructed her to check her BPs at home daily and if they continue to be elevated, follow with PCP for initiation of antihypertensive medications.       Medication Adjustments/Labs and Tests Ordered: Current medicines are reviewed at length with the patient today.  Concerns regarding medicines are outlined above.    Disposition:  Follow up prn  Signed Calub Tarnow Verne Spurr, MD, 12/07/2022 3:29 PM    [facility] [address] [place], [place], [place] [address]

## 2022-12-27 ENCOUNTER — Telehealth: Payer: Self-pay | Admitting: Rheumatology

## 2022-12-27 NOTE — Telephone Encounter (Signed)
Please ask patient if she would like to schedule a televisit to discuss labs.  It would be difficult to explain the lab work and other concerns over the phone.

## 2022-12-27 NOTE — Telephone Encounter (Signed)
Patient called requesting a return call to discuss her labwork results from her appointment with Dr. Corliss Skains on 08/04/22.  Patient refused to schedule a follow-up appointment with Dr. Corliss Skains stating she was told that she doesn't treat fibromyalgia and to join the [facility].  Patient requested to schedule with another provider or to be called with the results.

## 2022-12-28 NOTE — Telephone Encounter (Signed)
Returned call to patient to ask if she would like to schedule a televisit to discuss labs. Advised her it  would be difficult to explain the lab work and other concerns over the phone. Patient asked if she would need to schedule an appointment with Dr. Corliss Skains. Patient advised as she has established with Dr. Corliss Skains she would have to schedule her follow up with her. Patient transferred to the front to schedule a virtual visit.
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
## 2023-01-05 ENCOUNTER — Ambulatory Visit: Payer: Medicaid Other | Attending: Rheumatology | Admitting: Rheumatology

## 2023-01-05 ENCOUNTER — Encounter: Payer: Self-pay | Admitting: Rheumatology

## 2023-01-05 VITALS — Ht 65.0 in

## 2023-01-05 DIAGNOSIS — R768 Other specified abnormal immunological findings in serum: Secondary | ICD-10-CM

## 2023-01-05 DIAGNOSIS — M797 Fibromyalgia: Secondary | ICD-10-CM | POA: Diagnosis not present

## 2023-01-05 DIAGNOSIS — M546 Pain in thoracic spine: Secondary | ICD-10-CM | POA: Diagnosis not present

## 2023-01-05 DIAGNOSIS — Z8639 Personal history of other endocrine, nutritional and metabolic disease: Secondary | ICD-10-CM

## 2023-01-05 DIAGNOSIS — R1084 Generalized abdominal pain: Secondary | ICD-10-CM

## 2023-01-05 DIAGNOSIS — R1013 Epigastric pain: Secondary | ICD-10-CM

## 2023-01-05 DIAGNOSIS — M62838 Other muscle spasm: Secondary | ICD-10-CM | POA: Diagnosis not present

## 2023-01-05 DIAGNOSIS — R569 Unspecified convulsions: Secondary | ICD-10-CM

## 2023-01-10 ENCOUNTER — Telehealth: Payer: Self-pay | Admitting: Rheumatology

## 2023-01-10 ENCOUNTER — Other Ambulatory Visit: Payer: Self-pay | Admitting: *Deleted

## 2023-01-10 DIAGNOSIS — M546 Pain in thoracic spine: Secondary | ICD-10-CM

## 2023-01-10 DIAGNOSIS — R768 Other specified abnormal immunological findings in serum: Secondary | ICD-10-CM

## 2023-01-10 DIAGNOSIS — M62838 Other muscle spasm: Secondary | ICD-10-CM

## 2023-01-10 DIAGNOSIS — M542 Cervicalgia: Secondary | ICD-10-CM

## 2023-01-10 DIAGNOSIS — M797 Fibromyalgia: Secondary | ICD-10-CM

## 2023-01-10 NOTE — Telephone Encounter (Signed)
Pts pcp called on behave of pt stating she would like to change providers in our office. Pt is upset about Dr. Reginia Forts comment on telling her to join the [facility]. Pt would like a phone call on this issue at [phone].

## 2023-01-10 NOTE — Telephone Encounter (Signed)
I called patient, patient requested transfer of care.

## 2023-02-23 ENCOUNTER — Other Ambulatory Visit (INDEPENDENT_AMBULATORY_CARE_PROVIDER_SITE_OTHER): Payer: Medicaid Other

## 2023-02-23 ENCOUNTER — Other Ambulatory Visit: Payer: Self-pay

## 2023-02-23 ENCOUNTER — Encounter: Payer: Self-pay | Admitting: Orthopedic Surgery

## 2023-02-23 ENCOUNTER — Ambulatory Visit: Payer: Medicaid Other | Admitting: Orthopedic Surgery

## 2023-02-23 VITALS — BP 143/89 | HR 80 | Ht 65.0 in | Wt 214.0 lb

## 2023-02-23 DIAGNOSIS — M79672 Pain in left foot: Secondary | ICD-10-CM

## 2023-02-23 DIAGNOSIS — M79671 Pain in right foot: Secondary | ICD-10-CM | POA: Diagnosis not present
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
## 2023-02-25 ENCOUNTER — Ambulatory Visit: Payer: Medicaid Other | Admitting: Orthopedic Surgery

## 2023-05-02 ENCOUNTER — Encounter (INDEPENDENT_AMBULATORY_CARE_PROVIDER_SITE_OTHER): Payer: Self-pay | Admitting: Nurse Practitioner

## 2023-05-02 ENCOUNTER — Ambulatory Visit (INDEPENDENT_AMBULATORY_CARE_PROVIDER_SITE_OTHER): Payer: Medicaid Other | Admitting: Nurse Practitioner

## 2023-05-02 VITALS — BP 133/95 | HR 76 | Resp 16 | Ht 65.5 in | Wt 215.6 lb

## 2023-05-02 DIAGNOSIS — M79605 Pain in left leg: Secondary | ICD-10-CM | POA: Diagnosis not present

## 2023-05-02 DIAGNOSIS — M79671 Pain in right foot: Secondary | ICD-10-CM

## 2023-05-02 DIAGNOSIS — M79601 Pain in right arm: Secondary | ICD-10-CM

## 2023-05-02 DIAGNOSIS — M79604 Pain in right leg: Secondary | ICD-10-CM | POA: Diagnosis not present

## 2023-05-02 DIAGNOSIS — M79602 Pain in left arm: Secondary | ICD-10-CM

## 2023-05-02 DIAGNOSIS — M79672 Pain in left foot: Secondary | ICD-10-CM
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
## 2023-05-16 ENCOUNTER — Other Ambulatory Visit (INDEPENDENT_AMBULATORY_CARE_PROVIDER_SITE_OTHER): Payer: Self-pay | Admitting: Nurse Practitioner

## 2023-05-16 DIAGNOSIS — M79605 Pain in left leg: Secondary | ICD-10-CM

## 2023-05-16 DIAGNOSIS — M79601 Pain in right arm: Secondary | ICD-10-CM

## 2023-05-17 NOTE — Progress Notes (Signed)
 Office Visit Note  Patient: Madison Pratt             Date of Birth: 04-26-1990           MRN: [id number]             PCP: Madison Reamer, DO Referring: Madison Reamer, DO Visit Date: 05/31/2023 Occupation: @GUAROCC @  Subjective:  Positive ANA, fatigue  History of Present Illness: Madison Pratt is a 33 y.o. female returns today after her last televisit on January 05, 2023.  She states she continues to have some redness on her face, fatigue, dry mouth, dry eyes, oral ulcers, muscle pain, joint pain hair loss and photosensitivity.  She states her hands stay red.  She also notices numbness in her hands.  Pain concern is facial redness and muscle pain.  She states about a month ago she had an upper respiratory tract infection.  Patient states that her anxiety and depression is getting worse.   Patient reports morning stiffness for several hours.   Patient Reports nocturnal pain.  Difficulty dressing/grooming: Denies Difficulty climbing stairs: Reports Difficulty getting out of chair: Reports Difficulty using hands for taps, buttons, cutlery, and/or writing: Denies  Review of Systems  Constitutional:  Positive for fatigue.  HENT:  Positive for mouth sores and mouth dryness.   Eyes:  Positive for dryness.  Respiratory:  Negative for difficulty breathing.   Cardiovascular:  Positive for palpitations.  Gastrointestinal:  Negative for blood in stool, constipation and diarrhea.  Endocrine: Negative for increased urination.  Genitourinary:  Positive for involuntary urination.  Musculoskeletal:  Positive for joint pain, joint pain, myalgias, morning stiffness, muscle tenderness and myalgias. Negative for gait problem, joint swelling and muscle weakness.  Skin:  Positive for color change, rash, hair loss and sensitivity to sunlight.  Allergic/Immunologic: Positive for susceptible to infections.  Neurological:  Positive for numbness. Negative for dizziness and headaches.   Hematological:  Negative for swollen glands.  Psychiatric/Behavioral:  Positive for depressed mood and sleep disturbance. The patient is nervous/anxious.     PMFS History:  Patient Active Problem List   Diagnosis Date Noted   Non-cardiac chest pain 12/07/2022   Elevated blood pressure reading without diagnosis of hypertension 12/07/2022   Generalized abdominal pain 04/24/2014   Dyspepsia 04/24/2014   Pap smear of cervix with ASCUS, cannot exclude HGSIL 12/11/2013    Past Medical History:  Diagnosis Date   Cerebral atrophy (HCC)    per patient   Chronic abdominal pain    History of hypothyroidism    Scoliosis     Family History  Problem Relation Age of Onset   Stroke Mother    Stroke Father    Healthy Brother    Healthy Daughter    Healthy Son    Colon cancer Neg Hx    Past Surgical History:  Procedure Laterality Date   CESAREAN SECTION     09/05/2013, 2020   cranial surgery     LEEP     2016?   TONSILLECTOMY  03/09/1999   Social History   Social History Narrative   Not on file   There is no immunization history for the selected administration types on file for this patient.   Objective: Vital Signs: BP 130/86 (BP Location: Left Arm, Patient Position: Sitting, Cuff Size: Normal)   Pulse 83   Resp 14   Ht 5\' 5"  (1.651 m)   Wt 213 lb 12.8 oz (97 kg)   BMI 35.58 kg/m  Physical Exam Vitals and nursing note reviewed.  Constitutional:      Appearance: She is well-developed.  HENT:     Head: Normocephalic and atraumatic.  Eyes:     Conjunctiva/sclera: Conjunctivae normal.  Cardiovascular:     Rate and Rhythm: Normal rate and regular rhythm.     Heart sounds: Normal heart sounds.  Pulmonary:     Effort: Pulmonary effort is normal.     Breath sounds: Normal breath sounds.  Abdominal:     General: Bowel sounds are normal.     Palpations: Abdomen is soft.  Musculoskeletal:     Cervical back: Normal range of motion.  Lymphadenopathy:     Cervical: No  cervical adenopathy.  Skin:    General: Skin is warm and dry.     Capillary Refill: Capillary refill takes less than 2 seconds.     Comments: Mild generalized facial erythema was noted.  No nailbed capillary changes been noted.  She had good capillary refill.  No sclerodactyly was noted.  Neurological:     Mental Status: She is alert and oriented to person, place, and time.  Psychiatric:        Behavior: Behavior normal.      Musculoskeletal Exam: Cervical, thoracic and lumbar spine were in good range of motion.  Shoulder joints, elbow joints, wrist joints, MCPs PIPs and DIPs were in good range of motion with no synovitis.  Hip joints and knee joints were in good range of motion without any warmth swelling or effusion.  She had generalized hyperalgesia and positive tender points.  CDAI Exam: CDAI Score: -- Patient Global: --; Provider Global: -- Swollen: --; Tender: -- Joint Exam 05/31/2023   No joint exam has been documented for this visit   There is currently no information documented on the homunculus. Go to the Rheumatology activity and complete the homunculus joint exam.  Investigation: No additional findings.  Imaging: DG Chest Port 1 View Result Date: 05/26/2023 CLINICAL DATA:  Productive cough and congestion. EXAM: PORTABLE CHEST 1 VIEW COMPARISON:  September 23, 2022 FINDINGS: The heart size and mediastinal contours are within normal limits. Very mild atelectasis is seen within the bilateral lung bases. There is no evidence of focal consolidation, pleural effusion or pneumothorax. The visualized skeletal structures are unremarkable. IMPRESSION: No acute cardiopulmonary disease. Electronically Signed   By: Aram Candela M.D.   On: 05/26/2023 22:26   VAS [place] ABI WITH/WO TBI Result Date: 05/19/2023  LOWER EXTREMITY DOPPLER STUDY Patient Name:  Madison Pratt  Date of Exam:   05/18/2023 Medical Rec #: [id number]       Accession #:    [id number] Date of Birth: 1991/02/13       Patient  Gender: F Patient Age:   60 years Exam Location:  [facility] Procedure:      VAS [place] ABI WITH/WO TBI Referring Phys: Levora Dredge --------------------------------------------------------------------------------  Indications: Rest pain.  Performing Technologist: Debbe Bales RVS  Examination Guidelines: A complete evaluation includes at minimum, Doppler waveform signals and systolic blood pressure reading at the level of bilateral brachial, anterior tibial, and posterior tibial arteries, when vessel segments are accessible. Bilateral testing is considered an integral part of a complete examination. Photoelectric Plethysmograph (PPG) waveforms and toe systolic pressure readings are included as required and additional duplex testing as needed. Limited examinations for reoccurring indications may be performed as noted.  ABI Findings: +---------+------------------+-----+---------+--------+ Right    Rt Pressure (mmHg)IndexWaveform Comment  +---------+------------------+-----+---------+--------+ Brachial 132                                      +---------+------------------+-----+---------+--------+  ATA      151               1.10 triphasic         +---------+------------------+-----+---------+--------+ PTA      177               1.29 triphasic         +---------+------------------+-----+---------+--------+ [facility]               1.77 Normal            +---------+------------------+-----+---------+--------+ +---------+------------------+-----+---------+-------+ Left     Lt Pressure (mmHg)IndexWaveform Comment +---------+------------------+-----+---------+-------+ Brachial 137                                     +---------+------------------+-----+---------+-------+ ATA      163               1.19 triphasic        +---------+------------------+-----+---------+-------+ PTA      170               1.24 triphasic         +---------+------------------+-----+---------+-------+ Great Toe191               1.39 Normal           +---------+------------------+-----+---------+-------+ +-------+-----------+-----------+------------+------------+ ABI/TBIToday's ABIToday's TBIPrevious ABIPrevious TBI +-------+-----------+-----------+------------+------------+ Right  1.29       1.77                                +-------+-----------+-----------+------------+------------+ Left   1.24       1.39                                +-------+-----------+-----------+------------+------------+  Summary: Right: Resting right ankle-brachial index is within normal range. The right toe-brachial index is normal. Left: Resting left ankle-brachial index is within normal range. The left toe-brachial index is normal. *See table(s) above for measurements and observations.  Electronically signed by Levora Dredge MD on 05/19/2023 at 3:57:21 PM.    Final    VAS [place] LOWER EXTREMITY VENOUS REFLUX Result Date: 05/19/2023  Lower Venous Reflux Study Patient Name:  NENA HAMPE  Date of Exam:   05/18/2023 Medical Rec #: [id number]       Accession #:    [id number] Date of Birth: 07/01/1990       Patient Gender: F Patient Age:   7 years Exam Location:  [facility] Procedure:      VAS [place] LOWER EXTREMITY VENOUS REFLUX Referring Phys: Sheppard Plumber --------------------------------------------------------------------------------  Indications: Pain.  Performing Technologist: Debbe Bales RVS  Examination Guidelines: A complete evaluation includes B-mode imaging, spectral Doppler, color Doppler, and power Doppler as needed of all accessible portions of each vessel. Bilateral testing is considered an integral part of a complete examination. Limited examinations for reoccurring indications may be performed as noted. The reflux portion of the exam is performed with the patient in reverse Trendelenburg. Significant venous reflux is defined as  >500 ms in the superficial venous system, and >1 second in the deep venous system.  Venous Reflux Times +--------------+---------+------+-----------+------------+--------+ RIGHT         Reflux NoRefluxReflux TimeDiameter cmsComments  Yes                                  +--------------+---------+------+-----------+------------+--------+ CFV           no                                             +--------------+---------+------+-----------+------------+--------+ FV prox       no                                             +--------------+---------+------+-----------+------------+--------+ FV mid        no                                             +--------------+---------+------+-----------+------------+--------+ FV dist       no                                             +--------------+---------+------+-----------+------------+--------+ Popliteal     no                                             +--------------+---------+------+-----------+------------+--------+ GSV at SFJ    no                            .76              +--------------+---------+------+-----------+------------+--------+ GSV prox thighno                            .76              +--------------+---------+------+-----------+------------+--------+ GSV mid thigh no                            .40              +--------------+---------+------+-----------+------------+--------+ GSV dist thighno                            .40              +--------------+---------+------+-----------+------------+--------+ GSV at knee   no                            .32              +--------------+---------+------+-----------+------------+--------+ GSV prox calf no                            .31              +--------------+---------+------+-----------+------------+--------+ SSV Pop Fossa no                            .  36               +--------------+---------+------+-----------+------------+--------+  +--------------+---------+------+-----------+------------+--------+ LEFT          Reflux NoRefluxReflux TimeDiameter cmsComments                         Yes                                  +--------------+---------+------+-----------+------------+--------+ CFV           no                                             +--------------+---------+------+-----------+------------+--------+ FV prox       no                                             +--------------+---------+------+-----------+------------+--------+ FV mid        no                                             +--------------+---------+------+-----------+------------+--------+ FV dist       no                                             +--------------+---------+------+-----------+------------+--------+ Popliteal     no                                             +--------------+---------+------+-----------+------------+--------+ GSV at SFJ    no                            .64              +--------------+---------+------+-----------+------------+--------+ GSV prox thighno                            .68              +--------------+---------+------+-----------+------------+--------+ GSV mid thigh no                            .53              +--------------+---------+------+-----------+------------+--------+ GSV dist thighno                            .51              +--------------+---------+------+-----------+------------+--------+ GSV at knee   no                            .45              +--------------+---------+------+-----------+------------+--------+ GSV prox calf no                            .  31              +--------------+---------+------+-----------+------------+--------+ SSV Pop Fossa no                            .41               +--------------+---------+------+-----------+------------+--------+   Summary: Bilateral: - No evidence of deep vein thrombosis seen in the lower extremities, bilaterally, from the common femoral through the popliteal veins. - No evidence of superficial venous thrombosis in the lower extremities, bilaterally. - No evidence of deep venous insufficiency seen bilaterally in the lower extremity. - No evidence of superficial venous reflux seen in the greater saphenous veins bilaterally. - No evidence of superficial venous reflux seen in the short saphenous veins bilaterally.  *See table(s) above for measurements and observations. Electronically signed by Levora Dredge MD on 05/19/2023 at 3:57:04 PM.    Final    VAS US CAROTID Result Date: 05/19/2023 Carotid Arterial Duplex Study Patient Name:  DILPREET FAIRES  Date of Exam:   05/18/2023 Medical Rec #: [id number]       Accession #:    [id number] Date of Birth: [birth year]       Patient Gender: F Patient Age:   78 years Exam Location:  [facility] Procedure:      VAS US CAROTID Referring Phys: Sheppard Plumber --------------------------------------------------------------------------------  Indications: Left bruit. Performing Technologist: Debbe Bales RVS  Examination Guidelines: A complete evaluation includes B-mode imaging, spectral Doppler, color Doppler, and power Doppler as needed of all accessible portions of each vessel. Bilateral testing is considered an integral part of a complete examination. Limited examinations for reoccurring indications may be performed as noted.  Right Carotid Findings: +----------+--------+--------+--------+------------------+--------+           PSV cm/sEDV cm/sStenosisPlaque DescriptionComments +----------+--------+--------+--------+------------------+--------+ CCA Prox  94      19                                         +----------+--------+--------+--------+------------------+--------+ CCA Mid   91      24                                          +----------+--------+--------+--------+------------------+--------+ CCA Distal75      21                                         +----------+--------+--------+--------+------------------+--------+ ICA Prox  75      15                                         +----------+--------+--------+--------+------------------+--------+ ICA Mid   57      21                                         +----------+--------+--------+--------+------------------+--------+ ICA Distal76      34                                         +----------+--------+--------+--------+------------------+--------+  ECA       75      17                                         +----------+--------+--------+--------+------------------+--------+ +----------+--------+-------+--------+-------------------+           PSV cm/sEDV cmsDescribeArm Pressure (mmHG) +----------+--------+-------+--------+-------------------+ [id number]      0                                  +----------+--------+-------+--------+-------------------+ +---------+--------+--+--------+--+ VertebralPSV cm/s44EDV cm/s18 +---------+--------+--+--------+--+  Left Carotid Findings: +----------+--------+--------+--------+------------------+--------+           PSV cm/sEDV cm/sStenosisPlaque DescriptionComments +----------+--------+--------+--------+------------------+--------+ CCA Prox  108     22                                         +----------+--------+--------+--------+------------------+--------+ CCA Mid   116     28                                         +----------+--------+--------+--------+------------------+--------+ CCA Distal91      27                                         +----------+--------+--------+--------+------------------+--------+ ICA Prox  62      20                                          +----------+--------+--------+--------+------------------+--------+ ICA Mid   68      33                                         +----------+--------+--------+--------+------------------+--------+ ICA Distal107     50                                         +----------+--------+--------+--------+------------------+--------+ ECA       95      15                                         +----------+--------+--------+--------+------------------+--------+ +----------+--------+--------+--------+-------------------+           PSV cm/sEDV cm/sDescribeArm Pressure (mmHG) +----------+--------+--------+--------+-------------------+ [id number]      0                                   +----------+--------+--------+--------+-------------------+ +---------+--------+--+--------+--+ VertebralPSV cm/s37EDV cm/s15 +---------+--------+--+--------+--+   Summary: Right Carotid: Velocities in the right ICA are consistent with a 1-39% stenosis. Left Carotid: Velocities in the left ICA are consistent with a 1-39% stenosis. Vertebrals:  Bilateral vertebral arteries demonstrate antegrade flow. Subclavians: Normal flow hemodynamics  were seen in bilateral subclavian              arteries. *See table(s) above for measurements and observations.  Electronically signed by Levora Dredge MD on 05/19/2023 at 3:56:06 PM.    Final     Recent Labs: Lab Results  Component Value Date   WBC 5.9 07/25/2009   HGB 11.9 (L) 07/25/2009   PLT 202 07/25/2009   NA 139 07/25/2009   K 3.6 07/25/2009   CL 107 07/25/2009   CO2 27 07/25/2009   GLUCOSE 84 07/25/2009   BUN 9 07/25/2009   CREATININE 0.51 07/25/2009   BILITOT 0.4 07/25/2009   ALKPHOS 49 07/25/2009   AST 22 07/25/2009   ALT 20 07/25/2009   PROT 6.8 07/25/2009   ALBUMIN 4.1 07/25/2009   CALCIUM 9.3 07/25/2009   GFRAA  07/25/2009    >60        The eGFR has been calculated using the MDRD equation. This calculation has not been validated in  all clinical situations. eGFR's persistently <60 mL/min signify possible Chronic Kidney Disease.    Speciality Comments: No specialty comments available.  Procedures:  No procedures performed Allergies: Patient has no known allergies.   Assessment / Plan:     Visit Diagnoses: Positive ANA (antinuclear antibody) - Aug 04, 2022 ANA 1: 640 nuclear dot, 1: 320 nuclear homogeneous, C3-C4 normal, TPO antibody was positive. -Patient is concerned that she may be having an autoimmune disease flare.  She states she has been having increased fatigue, dry mouth, dry eyes, arthralgias, myalgias, facial redness, photosensitivity, hair loss.  Generalized fissure redness was noted.  Look nailbed capillary changes was noted.  She had good capillary refill.  No significant hair thinning was noted.  No synovitis was noted on the examination.  I will recheck labs today.  She generalized hyperalgesia and positive tender points suggestive of fibromyalgia syndrome.  Plan: CBC with Differential/Platelet, COMPLETE METABOLIC PANEL WITH GFR, Anti-DNA antibody, double-stranded, C3 and C4, ANA.  Will contact her when the lab results are available.  She was advised to contact us if her symptoms get worse or she develops any new symptoms.  Neck pain -she continues to have neck pain and stiffness.  January 18, 2021 CT cervical spine normal.  Pain in thoracic spine -she has ongoing thoracic pain.  August 22, 2021 CT thoracic spine mild the exaggerated kyphosis with mild right forward acute curvature of the mid and upper thoracic spine  Fibromyalgia -she continues to have generalized pain and discomfort from fibromyalgia.  She had positive tender points, joint generalized pain and hyperalgesia.  Benefits of water aerobics, swimming and stretching were discussed.  Patient would like to go to physical therapy.  Referral was placed.  She will also benefit from being on antidepressant medication.  Her anxiety and depression is getting  worse per patient.  Plan: Ambulatory referral to Physical Therapy  Muscular deconditioning - Plan: Ambulatory referral to Physical Therapy  Seizure-like activity [facility]) - Jul 12, 2021 MRI brain no abnormality noted.  Normal MRI of the brain. Jul 12, 2021-MRA of the brain normal.  Vertebral arteries normal.  History of hypothyroidism - she has history of hypothyroidism and taking Synthroid.  TPO antibodies positive.  Anxiety and depression-patient states her anxiety and depression is getting worse and she has an appointment coming up with her PCP to start on the medication.  Cough-patient states she recently had upper respiratory tract infection.  She was given prescription for prednisone and Medrol but  she did not take those medications.  She continues to have cough.  She denies any fever.  She states her family members were also sick with upper respiratory tract infection.  Orders: Orders Placed This Encounter  Procedures   CBC with Differential/Platelet   COMPLETE METABOLIC PANEL WITH GFR   Anti-DNA antibody, double-stranded   C3 and C4   ANA   Ambulatory referral to Physical Therapy   No orders of the defined types were placed in this encounter.   Follow-Up Instructions: Return in about 6 months (around 12/01/2023) for Positive ANA.   Pollyann Savoy, MD  Note - This record has been created using Animal nutritionist.  Chart creation errors have been sought, but may not always  have been located. Such creation errors do not reflect on  the standard of medical care.

## 2023-05-18 ENCOUNTER — Ambulatory Visit (INDEPENDENT_AMBULATORY_CARE_PROVIDER_SITE_OTHER): Payer: Medicaid Other

## 2023-05-18 DIAGNOSIS — M79601 Pain in right arm: Secondary | ICD-10-CM | POA: Diagnosis not present

## 2023-05-18 DIAGNOSIS — M79604 Pain in right leg: Secondary | ICD-10-CM

## 2023-05-18 DIAGNOSIS — M79605 Pain in left leg: Secondary | ICD-10-CM

## 2023-05-18 DIAGNOSIS — M79602 Pain in left arm: Secondary | ICD-10-CM

## 2023-05-19 ENCOUNTER — Encounter (INDEPENDENT_AMBULATORY_CARE_PROVIDER_SITE_OTHER): Payer: Self-pay | Admitting: Nurse Practitioner

## 2023-05-19 ENCOUNTER — Ambulatory Visit (INDEPENDENT_AMBULATORY_CARE_PROVIDER_SITE_OTHER): Payer: Medicaid Other | Admitting: Nurse Practitioner

## 2023-05-19 VITALS — BP 128/85 | HR 96 | Resp 18 | Ht 65.0 in | Wt 214.6 lb

## 2023-05-19 DIAGNOSIS — M79672 Pain in left foot: Secondary | ICD-10-CM

## 2023-05-19 DIAGNOSIS — M79604 Pain in right leg: Secondary | ICD-10-CM

## 2023-05-19 DIAGNOSIS — M79601 Pain in right arm: Secondary | ICD-10-CM | POA: Diagnosis not present

## 2023-05-19 DIAGNOSIS — M79605 Pain in left leg: Secondary | ICD-10-CM

## 2023-05-19 DIAGNOSIS — M79602 Pain in left arm: Secondary | ICD-10-CM

## 2023-05-19 DIAGNOSIS — M79671 Pain in right foot: Secondary | ICD-10-CM | POA: Diagnosis not present

## 2023-05-19 LAB — VAS US ABI WITH/WO TBI
Left ABI: 1.24
Right ABI: 1.29

## 2023-05-19 MED ORDER — AMLODIPINE BESYLATE 5 MG PO TABS: 5.0000 mg | ORAL_TABLET | Freq: Every day | ORAL | 2 refills | Status: AC
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
## 2023-05-26 ENCOUNTER — Emergency Department (HOSPITAL_COMMUNITY)
Admission: EM | Admit: 2023-05-26 | Discharge: 2023-05-26 | Disposition: A | Discharge status: Home / Self Care | Attending: Emergency Medicine | Admitting: Emergency Medicine

## 2023-05-26 ENCOUNTER — Encounter (HOSPITAL_COMMUNITY): Payer: Self-pay

## 2023-05-26 ENCOUNTER — Emergency Department (HOSPITAL_COMMUNITY)

## 2023-05-26 ENCOUNTER — Other Ambulatory Visit: Payer: Self-pay

## 2023-05-26 DIAGNOSIS — J101 Influenza due to other identified influenza virus with other respiratory manifestations: Secondary | ICD-10-CM | POA: Insufficient documentation

## 2023-05-26 DIAGNOSIS — R059 Cough, unspecified: Secondary | ICD-10-CM | POA: Diagnosis present

## 2023-05-26 LAB — RESP PANEL BY RT-PCR (RSV, FLU A&B, COVID)  RVPGX2
Influenza A by PCR: POSITIVE — AB
Influenza B by PCR: NEGATIVE
Resp Syncytial Virus by PCR: NEGATIVE
SARS Coronavirus 2 by RT PCR: NEGATIVE

## 2023-05-26 MED ORDER — PROMETHAZINE-DM 6.25-15 MG/5ML PO SYRP: 5.0000 mL | ORAL_SOLUTION | Freq: Four times a day (QID) | ORAL | 0 refills | Status: DC | PRN

## 2023-05-26 MED ORDER — METHYLPREDNISOLONE 4 MG PO TBPK: ORAL_TABLET | ORAL | 0 refills | Status: DC

## 2023-05-26 NOTE — Discharge Instructions (Signed)
 Please follow-up closely with your primary care doctor on an outpatient basis.  Return to emergency department immediately for any new or worsening symptoms.

## 2023-05-26 NOTE — ED Triage Notes (Signed)
 Pt from home complaining of productive cough, fever, generalized body aches, and chills. States that she had Covid earlier this month, but family has flu and she feels worse.

## 2023-05-26 NOTE — ED Provider Notes (Signed)
 [place] EMERGENCY DEPARTMENT AT [facility] Provider Note   CSN: [id number] Arrival date & time: 05/26/23  2053     History  Chief Complaint  Patient presents with   Fatigue    Madison Pratt is a 33 y.o. female.  Patient is a 33 year old female who presents emergency department the chief complaint of cough, fever, generalized bodyaches and chills.  She notes that she was diagnosed with COVID-19 approximately 3 weeks ago but it started getting worse again the past few days.  She notes that she has been exposed to her children who are sick with influenza.  Patient denies any abdominal pain, nausea, vomiting, diarrhea.  She has had no associated chest pain or shortness of breath.        Home Medications Prior to Admission medications   Medication Sig Start Date End Date Taking? Authorizing Provider  amLODipine (NORVASC) 5 MG tablet Take 1 tablet (5 mg total) by mouth daily. 05/19/23 05/18/24  Georgiana Spinner, NP  gabapentin (NEURONTIN) 300 MG capsule 1 capsule Orally three times a day 01/14/23   [provider]  ibuprofen (ADVIL) 800 MG tablet Take 800 mg by mouth 3 (three) times daily. 12/06/22   [provider]  levothyroxine (SYNTHROID) 50 MCG tablet TAKE ONE TABLET BY MOUTH ONCE DAILY ON AN EMPTY STOMACH for 90 05/29/20   [provider]  meloxicam (MOBIC) 15 MG tablet Take 15 mg by mouth daily. 01/14/23   [provider]  predniSONE (DELTASONE) 20 MG tablet Take 40 mg by mouth daily. 05/14/23   [provider]  traZODone (DESYREL) 50 MG tablet Take 50 mg by mouth at bedtime.    [provider]  VENTOLIN HFA 108 (90 Base) MCG/ACT inhaler Inhale 2 puffs into the lungs every 4 (four) hours as needed. 05/14/23   [provider]      Allergies    Patient has no known allergies.    Review of Systems   Review of Systems  Constitutional:  Positive for chills, fatigue and fever.  Respiratory:  Positive for cough.    All other systems reviewed and are negative.   Physical Exam Updated Vital Signs BP (!) 149/111 (BP Location: Left Arm)   Pulse (!) 104   Temp 99.9 F (37.7 C) (Oral)   Resp 18   Ht 5\' 5"  (1.651 m)   Wt 97.5 kg   SpO2 99%   BMI 35.78 kg/m  Physical Exam Vitals and nursing note reviewed.  Constitutional:      Appearance: Normal appearance.  HENT:     Head: Normocephalic and atraumatic.     Nose: Nose normal.     Mouth/Throat:     Mouth: Mucous membranes are moist.  Eyes:     Extraocular Movements: Extraocular movements intact.     Conjunctiva/sclera: Conjunctivae normal.     Pupils: Pupils are equal, round, and reactive to light.  Cardiovascular:     Rate and Rhythm: Normal rate and regular rhythm.     Pulses: Normal pulses.     Heart sounds: Normal heart sounds. No murmur heard.    No gallop.  Pulmonary:     Effort: Pulmonary effort is normal. No respiratory distress.     Breath sounds: Normal breath sounds. No wheezing or rales.  Abdominal:     General: Abdomen is flat. Bowel sounds are normal. There is no distension.     Palpations: Abdomen is soft.     Tenderness: There is no  abdominal tenderness. There is no guarding.  Musculoskeletal:        General: Normal range of motion.     Cervical back: Normal range of motion and neck supple.     Right lower leg: No edema.     Left lower leg: No edema.  Skin:    General: Skin is warm and dry.     Findings: No erythema or rash.  Neurological:     General: No focal deficit present.     Mental Status: She is alert and oriented to person, place, and time. Mental status is at baseline.  Psychiatric:        Mood and Affect: Mood normal.        Behavior: Behavior normal.        Thought Content: Thought content normal.        Judgment: Judgment normal.     ED Results / Procedures / Treatments   Labs (all labs ordered are listed, but only abnormal results are displayed) Labs Reviewed  RESP PANEL BY RT-PCR (RSV, FLU  A&B, COVID)  RVPGX2 - Abnormal; Notable for the following components:      Result Value   Influenza A by PCR POSITIVE (*)    All other components within normal limits    EKG None  Radiology DG Chest Port 1 View Result Date: 05/26/2023 CLINICAL DATA:  Productive cough and congestion. EXAM: PORTABLE CHEST 1 VIEW COMPARISON:  September 23, 2022 FINDINGS: The heart size and mediastinal contours are within normal limits. Very mild atelectasis is seen within the bilateral lung bases. There is no evidence of focal consolidation, pleural effusion or pneumothorax. The visualized skeletal structures are unremarkable. IMPRESSION: No acute cardiopulmonary disease. Electronically Signed   By: Aram Candela M.D.   On: 05/26/2023 22:26    Procedures Procedures    Medications Ordered in ED Medications - No data to display  ED Course/ Medical Decision Making/ A&P                                 Medical Decision Making Amount and/or Complexity of Data Reviewed Radiology: ordered.   This patient presents to the ED for concern of cough, congestion, generalized malaise fatigue, fever differential diagnosis includes acute viral syndrome, pneumonia, sepsis    Additional history obtained:  Additional history obtained from none External records from outside source obtained and reviewed including none   Lab Tests:  I Ordered, and personally interpreted labs.  The pertinent results include: Flu Enza a positive   Imaging Studies ordered:  I ordered imaging studies including chest x-ray I independently visualized and interpreted imaging which showed no acute cardiopulmonary process I agree with the radiologist interpretation   Medicines ordered and prescription drug management:  I ordered medication including prednisone, Promethazine DM for influenza A Reevaluation of the patient after these medicines showed that the patient improved I have reviewed the patients home medicines and have made  adjustments as needed   Problem List / ED Course:  Patient is doing well at this time and is stable for discharge home.  Discussed with patient she is positive for influenza A.  Patient does have stable vital signs at this point with no associated hypoxia.  Chest x-ray demonstrated no indication for pneumonia.  Discussed with patient we will continue symptomatic treatment on outpatient basis and recommend close follow-up with her primary care doctor.  Strict return precautions were discussed for any  new or worsening symptoms.  Patient voiced understanding and had no additional questions.   Social Determinants of Health:  None           Final Clinical Impression(s) / ED Diagnoses Final diagnoses:  None    Rx / DC Orders ED Discharge Orders     None         Kathlen Mody 05/26/23 2247    Bethann Berkshire, MD 05/27/23 1318
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
## 2023-05-31 ENCOUNTER — Encounter: Payer: Self-pay | Admitting: Rheumatology

## 2023-05-31 ENCOUNTER — Ambulatory Visit: Payer: Medicaid Other | Attending: Rheumatology | Admitting: Rheumatology

## 2023-05-31 VITALS — BP 130/86 | HR 83 | Resp 14 | Ht 65.0 in | Wt 213.8 lb

## 2023-05-31 DIAGNOSIS — R768 Other specified abnormal immunological findings in serum: Secondary | ICD-10-CM

## 2023-05-31 DIAGNOSIS — M546 Pain in thoracic spine: Secondary | ICD-10-CM | POA: Diagnosis not present

## 2023-05-31 DIAGNOSIS — M797 Fibromyalgia: Secondary | ICD-10-CM

## 2023-05-31 DIAGNOSIS — R569 Unspecified convulsions: Secondary | ICD-10-CM

## 2023-05-31 DIAGNOSIS — Z8639 Personal history of other endocrine, nutritional and metabolic disease: Secondary | ICD-10-CM

## 2023-05-31 DIAGNOSIS — M542 Cervicalgia: Secondary | ICD-10-CM | POA: Diagnosis not present

## 2023-05-31 DIAGNOSIS — R058 Other specified cough: Secondary | ICD-10-CM

## 2023-05-31 DIAGNOSIS — F419 Anxiety disorder, unspecified: Secondary | ICD-10-CM

## 2023-05-31 DIAGNOSIS — F32A Depression, unspecified: Secondary | ICD-10-CM

## 2023-05-31 DIAGNOSIS — R29898 Other symptoms and signs involving the musculoskeletal system: Secondary | ICD-10-CM

## 2023-06-02 NOTE — Progress Notes (Signed)
 CBC and CMP are normal.  Anti-DNA negative, complements normal.  Labs do not indicate an autoimmune disease flare.  ANA pending.

## 2023-06-03 LAB — CBC WITH DIFFERENTIAL/PLATELET
Absolute Lymphocytes: 1622 {cells}/uL (ref 850–3900)
Absolute Monocytes: 628 {cells}/uL (ref 200–950)
Basophils Absolute: 7 {cells}/uL (ref 0–200)
Basophils Relative: 0.1 %
Eosinophils Absolute: 83 {cells}/uL (ref 15–500)
Eosinophils Relative: 1.2 %
HCT: 39.6 % (ref 35.0–45.0)
Hemoglobin: 13.5 g/dL (ref 11.7–15.5)
MCH: 31.9 pg (ref 27.0–33.0)
MCHC: 34.1 g/dL (ref 32.0–36.0)
MCV: 93.6 fL (ref 80.0–100.0)
MPV: 9.6 fL (ref 7.5–12.5)
Monocytes Relative: 9.1 %
Neutro Abs: 4561 {cells}/uL (ref 1500–7800)
Neutrophils Relative %: 66.1 %
Platelets: 245 10*3/uL (ref 140–400)
RBC: 4.23 10*6/uL (ref 3.80–5.10)
RDW: 11.5 % (ref 11.0–15.0)
Total Lymphocyte: 23.5 %
WBC: 6.9 10*3/uL (ref 3.8–10.8)

## 2023-06-03 LAB — COMPREHENSIVE METABOLIC PANEL WITH GFR
AG Ratio: 1.7 (calc) (ref 1.0–2.5)
ALT: 22 U/L (ref 6–29)
AST: 13 U/L (ref 10–30)
Albumin: 4.3 g/dL (ref 3.6–5.1)
Alkaline phosphatase (APISO): 52 U/L (ref 31–125)
BUN: 17 mg/dL (ref 7–25)
CO2: 31 mmol/L (ref 20–32)
Calcium: 9.4 mg/dL (ref 8.6–10.2)
Chloride: 105 mmol/L (ref 98–110)
Creat: 0.53 mg/dL (ref 0.50–0.97)
Globulin: 2.6 g/dL (ref 1.9–3.7)
Glucose, Bld: 76 mg/dL (ref 65–99)
Potassium: 3.8 mmol/L (ref 3.5–5.3)
Sodium: 140 mmol/L (ref 135–146)
Total Bilirubin: 0.3 mg/dL (ref 0.2–1.2)
Total Protein: 6.9 g/dL (ref 6.1–8.1)
eGFR: 126 mL/min/{1.73_m2} (ref >=60)

## 2023-06-03 LAB — ANTI-NUCLEAR AB-TITER (ANA TITER)
ANA TITER: 1:320 {titer} — ABNORMAL HIGH
ANA Titer 1: 1:320 {titer} — ABNORMAL HIGH

## 2023-06-03 LAB — ANTI-DNA ANTIBODY, DOUBLE-STRANDED: ds DNA Ab: 1 [IU]/mL

## 2023-06-03 LAB — ANA: Anti Nuclear Antibody (ANA): POSITIVE — AB

## 2023-06-03 LAB — C3 AND C4
C3 Complement: 146 mg/dL (ref 83–193)
C4 Complement: 29 mg/dL (ref 15–57)

## 2023-06-03 NOTE — Progress Notes (Signed)
 ANA is positive at the lower titer of 1: 320.

## 2023-06-18 ENCOUNTER — Other Ambulatory Visit: Payer: Self-pay

## 2023-06-18 ENCOUNTER — Emergency Department (HOSPITAL_COMMUNITY)
Admission: EM | Admit: 2023-06-18 | Discharge: 2023-06-18 | Disposition: A | Discharge status: Home / Self Care | Attending: Emergency Medicine | Admitting: Emergency Medicine

## 2023-06-18 DIAGNOSIS — E039 Hypothyroidism, unspecified: Secondary | ICD-10-CM | POA: Diagnosis not present

## 2023-06-18 DIAGNOSIS — J101 Influenza due to other identified influenza virus with other respiratory manifestations: Secondary | ICD-10-CM | POA: Insufficient documentation

## 2023-06-18 DIAGNOSIS — Z87891 Personal history of nicotine dependence: Secondary | ICD-10-CM | POA: Insufficient documentation

## 2023-06-18 DIAGNOSIS — J069 Acute upper respiratory infection, unspecified: Secondary | ICD-10-CM

## 2023-06-18 DIAGNOSIS — R059 Cough, unspecified: Secondary | ICD-10-CM | POA: Diagnosis present

## 2023-06-18 LAB — RESP PANEL BY RT-PCR (RSV, FLU A&B, COVID)  RVPGX2
Influenza A by PCR: POSITIVE — AB
Influenza B by PCR: NEGATIVE
Resp Syncytial Virus by PCR: NEGATIVE
SARS Coronavirus 2 by RT PCR: NEGATIVE

## 2023-06-18 MED ORDER — GUAIFENESIN ER 600 MG PO TB12: 1200.0000 mg | ORAL_TABLET | Freq: Two times a day (BID) | ORAL | 0 refills | Status: AC | PRN

## 2023-06-18 NOTE — ED Notes (Signed)
 Pt ambulated to ED room. A&O x4. Pt stated, "We've been sick on and off for awhile. I been coughing up green stuff and when I blow my nose it is yellow. It's just this cough bothering me. I feel ok everywhere else.   Pt presenting with congested cough sputum not seen at this time. Pt denies chest pain or SOB.

## 2023-06-18 NOTE — ED Triage Notes (Signed)
 Pt c/o drainage and cough, states that she has had this ongoing since testing positive for Flu A, denies shob, denies CP, states productive cough with green sputum and yellow congestion when blowing nose

## 2023-06-18 NOTE — ED Provider Notes (Signed)
 [facility] EMERGENCY DEPARTMENT AT [facility] Provider Note  CSN: [id number] Arrival date & time: 06/18/23 0931  Chief Complaint(s) Cough  HPI Madison Pratt is a 33 y.o. female presenting with cough.  Patient reports cough, congestion, phlegm.  Had sore throat a few days ago.  Not having any fevers or chills currently.  No abdominal pain, lightheadedness, dizziness, nausea or vomiting, diarrhea.  Family is sick with similar symptoms   Past Medical History Past Medical History:  Diagnosis Date   Cerebral atrophy (HCC)    per patient   Chronic abdominal pain    History of hypothyroidism    Scoliosis    Patient Active Problem List   Diagnosis Date Noted   Non-cardiac chest pain 12/07/2022   Elevated blood pressure reading without diagnosis of hypertension 12/07/2022   Generalized abdominal pain 04/24/2014   Dyspepsia 04/24/2014   Pap smear of cervix with ASCUS, cannot exclude HGSIL 12/11/2013   Home Medication(s) Prior to Admission medications   Medication Sig Start Date End Date Taking? Authorizing Provider  guaiFENesin (MUCINEX) 600 MG 12 hr tablet Take 2 tablets (1,200 mg total) by mouth 2 (two) times daily as needed for cough. 06/18/23  Yes Mordecai Applebaum, MD  amLODipine (NORVASC) 5 MG tablet Take 1 tablet (5 mg total) by mouth daily. Patient not taking: Reported on 05/31/2023 05/19/23 05/18/24  Brown, Fallon E, NP  gabapentin (NEURONTIN) 300 MG capsule 1 capsule Orally three times a day 01/14/23   [provider]  ibuprofen (ADVIL) 800 MG tablet Take 800 mg by mouth 3 (three) times daily. 12/06/22   [provider]  levothyroxine (SYNTHROID) 50 MCG tablet TAKE ONE TABLET BY MOUTH ONCE DAILY ON AN EMPTY STOMACH for 90 05/29/20   [provider]  meloxicam (MOBIC) 15 MG tablet Take 15 mg by mouth daily. 01/14/23   [provider]  traZODone (DESYREL) 50 MG tablet Take 50 mg by mouth at bedtime.    [provider]  VENTOLIN  HFA 108 (90 Base) MCG/ACT inhaler Inhale 2 puffs into the lungs every 4 (four) hours as needed. 05/14/23   [provider]                                                                                                                                    Past Surgical History Past Surgical History:  Procedure Laterality Date   CESAREAN SECTION     09/05/2013, 2020   cranial surgery     LEEP     2016?   TONSILLECTOMY  03/09/1999   Family History Family History  Problem Relation Age of Onset   Stroke Mother    Stroke Father    Healthy Brother    Healthy Daughter    Healthy Son    Colon cancer Neg Hx     Social History Social History   Tobacco Use   Smoking status: Former  Current packs/day: 0.00    Types: Cigarettes    Quit date: 09/05/2012    Years since quitting: 10.7    Passive exposure: Never   Smokeless tobacco: Never  Vaping Use   Vaping status: Never Used  Substance Use Topics   Alcohol use: No    Alcohol/week: 0.0 standard drinks of alcohol    Comment: quit drinking 2014; Previously had 1 beer/day   Drug use: No   Allergies Patient has no known allergies.  Review of Systems Review of Systems  All other systems reviewed and are negative.   Physical Exam Vital Signs  I have reviewed the triage vital signs BP (!) 136/94 (BP Location: Left Arm)   Pulse 83   Temp 98 F (36.7 C) (Oral)   Resp 15   SpO2 99%  Physical Exam Vitals and nursing note reviewed.  Constitutional:      General: She is not in acute distress.    Appearance: She is well-developed.  HENT:     Head: Normocephalic and atraumatic.     Mouth/Throat:     Mouth: Mucous membranes are moist.  Eyes:     Pupils: Pupils are equal, round, and reactive to light.  Cardiovascular:     Rate and Rhythm: Normal rate and regular rhythm.     Heart sounds: No murmur heard. Pulmonary:     Effort: Pulmonary effort is normal. No respiratory distress.     Breath sounds: Normal breath sounds.   Abdominal:     General: Abdomen is flat.     Palpations: Abdomen is soft.     Tenderness: There is no abdominal tenderness.  Musculoskeletal:        General: No tenderness.     Right lower leg: No edema.     Left lower leg: No edema.  Skin:    General: Skin is warm and dry.  Neurological:     General: No focal deficit present.     Mental Status: She is alert. Mental status is at baseline.  Psychiatric:        Mood and Affect: Mood normal.        Behavior: Behavior normal.     ED Results and Treatments Labs (all labs ordered are listed, but only abnormal results are displayed) Labs Reviewed - No data to display                                                                                                                        Radiology No results found.  Pertinent labs & imaging results that were available during my care of the patient were reviewed by me and considered in my medical decision making (see MDM for details).  Medications Ordered in ED Medications - No data to display  Procedures Procedures  (including critical care time)  Medical Decision Making / ED Course   MDM:  33 year old presenting to the emergency department with cough, congestion.  Patient well-appearing, physical examination without any focal abnormality.  Lungs are clear on exam.  Suspect viral URI.  Extremely low concern for pneumonia.  She is here with her son who has similar symptoms.  Also low concern for other bacterial illness such as bacterial sinus infection.  Discussed self-care for URI. Will discharge patient to home. All questions answered. Patient comfortable with plan of discharge. Return precautions discussed with patient and specified on the after visit summary.        Medicines ordered and prescription drug management: Meds ordered this  encounter  Medications   guaiFENesin (MUCINEX) 600 MG 12 hr tablet    Sig: Take 2 tablets (1,200 mg total) by mouth 2 (two) times daily as needed for cough.    Dispense:  60 tablet    Refill:  0    -I have reviewed the patients home medicines and have made adjustments as needed   Social Determinants of Health:  Diagnosis or treatment significantly limited by social determinants of health: obesity  Co morbidities that complicate the patient evaluation  Past Medical History:  Diagnosis Date   Cerebral atrophy (HCC)    per patient   Chronic abdominal pain    History of hypothyroidism    Scoliosis       Dispostion: Disposition decision including need for hospitalization was considered, and patient discharged from emergency department.    Final Clinical Impression(s) / ED Diagnoses Final diagnoses:  Viral URI with cough     This chart was dictated using voice recognition software.  Despite best efforts to proofread,  errors can occur which can change the documentation meaning.    Mordecai Applebaum, MD 06/18/23 1141

## 2023-07-01 ENCOUNTER — Ambulatory Visit (INDEPENDENT_AMBULATORY_CARE_PROVIDER_SITE_OTHER): Admitting: Nurse Practitioner

## 2023-07-07 ENCOUNTER — Ambulatory Visit: Payer: Medicaid Other | Admitting: Rheumatology

## 2023-07-26 ENCOUNTER — Encounter (INDEPENDENT_AMBULATORY_CARE_PROVIDER_SITE_OTHER): Payer: Self-pay

## 2023-12-01 ENCOUNTER — Ambulatory Visit: Admitting: Physician Assistant

## 2024-03-04 ENCOUNTER — Emergency Department (HOSPITAL_COMMUNITY)
Admission: EM | Admit: 2024-03-04 | Discharge: 2024-03-04 | Disposition: A | Discharge status: Home / Self Care | Attending: Emergency Medicine | Admitting: Emergency Medicine

## 2024-03-04 ENCOUNTER — Other Ambulatory Visit: Payer: Self-pay

## 2024-03-04 ENCOUNTER — Encounter (HOSPITAL_COMMUNITY): Payer: Self-pay

## 2024-03-04 ENCOUNTER — Emergency Department (HOSPITAL_COMMUNITY)

## 2024-03-04 DIAGNOSIS — E039 Hypothyroidism, unspecified: Secondary | ICD-10-CM | POA: Insufficient documentation

## 2024-03-04 DIAGNOSIS — I10 Essential (primary) hypertension: Secondary | ICD-10-CM | POA: Diagnosis not present

## 2024-03-04 DIAGNOSIS — R590 Localized enlarged lymph nodes: Secondary | ICD-10-CM | POA: Diagnosis not present

## 2024-03-04 DIAGNOSIS — Z79890 Hormone replacement therapy (postmenopausal): Secondary | ICD-10-CM | POA: Insufficient documentation

## 2024-03-04 DIAGNOSIS — J069 Acute upper respiratory infection, unspecified: Secondary | ICD-10-CM | POA: Diagnosis not present

## 2024-03-04 DIAGNOSIS — Z79899 Other long term (current) drug therapy: Secondary | ICD-10-CM | POA: Insufficient documentation

## 2024-03-04 DIAGNOSIS — R509 Fever, unspecified: Secondary | ICD-10-CM | POA: Diagnosis present

## 2024-03-04 HISTORY — DX: Rheumatic tricuspid insufficiency: I07.1

## 2024-03-04 LAB — URINALYSIS, ROUTINE W REFLEX MICROSCOPIC
Bilirubin Urine: NEGATIVE
Glucose, UA: NEGATIVE mg/dL
Hgb urine dipstick: NEGATIVE
Ketones, ur: NEGATIVE mg/dL
Leukocytes,Ua: NEGATIVE
Nitrite: NEGATIVE
Protein, ur: NEGATIVE mg/dL
Specific Gravity, Urine: 1.004 — ABNORMAL LOW (ref 1.005–1.030)
pH: 7 (ref 5.0–8.0)

## 2024-03-04 LAB — RESP PANEL BY RT-PCR (RSV, FLU A&B, COVID)  RVPGX2
Influenza A by PCR: NEGATIVE
Influenza B by PCR: NEGATIVE
Resp Syncytial Virus by PCR: NEGATIVE
SARS Coronavirus 2 by RT PCR: NEGATIVE

## 2024-03-04 LAB — PREGNANCY, URINE: Preg Test, Ur: NEGATIVE

## 2024-03-04 MED ORDER — PREDNISONE 20 MG PO TABS: 40.0000 mg | ORAL_TABLET | Freq: Every day | ORAL | 0 refills | Status: AC

## 2024-03-04 MED ORDER — IBUPROFEN 800 MG PO TABS
800.0000 mg | ORAL_TABLET | Freq: Once | ORAL | Status: AC
  Administered 2024-03-04: 800 mg via ORAL
  Filled 2024-03-04: qty 1

## 2024-03-04 MED ORDER — VENTOLIN HFA 108 (90 BASE) MCG/ACT IN AERS: 2.0000 | INHALATION_SPRAY | RESPIRATORY_TRACT | 0 refills | Status: AC | PRN

## 2024-03-04 MED ORDER — DOXYCYCLINE HYCLATE 100 MG PO CAPS: 100.0000 mg | ORAL_CAPSULE | Freq: Two times a day (BID) | ORAL | 0 refills | Status: AC

## 2024-03-04 MED ORDER — BENZONATATE 100 MG PO CAPS
100.0000 mg | ORAL_CAPSULE | Freq: Once | ORAL | Status: AC
  Administered 2024-03-04: 100 mg via ORAL
  Filled 2024-03-04: qty 1

## 2024-03-04 MED ORDER — BENZONATATE 100 MG PO CAPS: 100.0000 mg | ORAL_CAPSULE | Freq: Three times a day (TID) | ORAL | 0 refills | Status: AC | PRN

## 2024-03-04 MED ORDER — ACETAMINOPHEN 325 MG PO TABS
650.0000 mg | ORAL_TABLET | Freq: Once | ORAL | Status: AC | PRN
  Administered 2024-03-04: 650 mg via ORAL
  Filled 2024-03-04: qty 2

## 2024-03-04 NOTE — Discharge Instructions (Addendum)
 Thank you for visiting the Emergency Department today. It was a pleasure to be part of your healthcare team.   Your were seen today for an ongoing URI, and your viral PCR testing was negative.  As discussed, continue Tylenol  and ibuprofen  as needed for fever and pain relief.  Rest, hydrate, and resume normal diet as tolerated.  You have been prescribed benzonatate , doxycycline , and prednisone  - I have also refilled your inhaler - you should take your medications as directed. If you have any questions about your medicines, please call your pharmacy or healthcare provider.  It is important to watch for warning signs such as worsening pain, fever, trouble breathing, or chest pain. If any of these happen, return to the Emergency Department or call 911.  Please follow up with your primary care provider within 1 week.   Thank you for trusting us  with your health.

## 2024-03-04 NOTE — ED Triage Notes (Addendum)
 Pt presents with fever, chills, cough, HA, body aches, and L flank pain x 2 weeks. Pt has a daughter that was positive for the flu. She has been taking Dayquil, Nyquil, and APAP. She has not taken any medication for her symptoms today. Pt has had seasonal COVID vaccine, but not flu vaccine.

## 2024-03-04 NOTE — ED Provider Notes (Signed)
 " [place] [facility] AT [facility] Provider Note   CSN: [id number] Arrival date & time: 03/04/24  1057     Patient presents with: Cough and Fever (Flu Like)   Madison Pratt is a 33 y.o. female  who presents with 10 days of flulike symptoms including fever, chills, myalgias, headache, nasal congestion, and productive cough.  The patient reports associated fatigue and decreased appetite, with intermittent nausea but denies persistent vomiting or diarrhea.  Symptoms began gradually and have progressively worsened despite supportive care at home including antipyretics and over-the-counter cold/flu medication.  The patient denies chest pain, shortness of breath, hemoptysis, syncope, confusion, neck stiffness, or focal neurological deficits.  She states that her biggest complaint at this time is the constant productive cough and subjective fevers at home up to 102F. The patient is in no acute distress.     Cough Associated symptoms: fever   Fever Associated symptoms: cough        Prior to Admission medications  Medication Sig Start Date End Date Taking? Authorizing Provider  benzonatate  (TESSALON ) 100 MG capsule Take 1 capsule (100 mg total) by mouth 3 (three) times daily as needed for up to 7 days for cough. 03/04/24 03/11/24 Yes Adora Yeh L, PA  doxycycline  (VIBRAMYCIN ) 100 MG capsule Take 1 capsule (100 mg total) by mouth 2 (two) times daily for 5 days. 03/04/24 03/09/24 Yes Ashby Moskal L, PA  predniSONE  (DELTASONE ) 20 MG tablet Take 2 tablets (40 mg total) by mouth daily with breakfast for 5 days. 03/04/24 03/09/24 Yes Trigger Frasier L, PA  amLODipine  (NORVASC ) 5 MG tablet Take 1 tablet (5 mg total) by mouth daily. Patient not taking: Reported on 05/31/2023 05/19/23 05/18/24  Brown, Fallon E, NP  gabapentin (NEURONTIN) 300 MG capsule 1 capsule Orally three times a day 01/14/23   [provider]  guaiFENesin  (MUCINEX ) 600 MG 12 hr tablet Take 2 tablets (1,200 mg  total) by mouth 2 (two) times daily as needed for cough. 06/18/23   Francesca Elsie CROME, MD  ibuprofen  (ADVIL ) 800 MG tablet Take 800 mg by mouth 3 (three) times daily. 12/06/22   [provider]  levothyroxine (SYNTHROID) 50 MCG tablet TAKE ONE TABLET BY MOUTH ONCE DAILY ON AN EMPTY STOMACH for 90 05/29/20   [provider]  meloxicam (MOBIC) 15 MG tablet Take 15 mg by mouth daily. 01/14/23   [provider]  traZODone (DESYREL) 50 MG tablet Take 50 mg by mouth at bedtime.    [provider]  VENTOLIN  HFA 108 (90 Base) MCG/ACT inhaler Inhale 2 puffs into the lungs every 4 (four) hours as needed. 03/04/24   Arabel Barcenas L, PA    Allergies: Patient has no known allergies.    Review of Systems  Constitutional:  Positive for fever.  Respiratory:  Positive for cough.     Updated Vital Signs BP (!) 121/92 (BP Location: Right Arm)   Pulse 95   Temp 98.2 F (36.8 C) (Oral)   Resp 16   Ht 5' 5 (1.651 m)   Wt 91.2 kg   LMP 02/14/2024   SpO2 96%   BMI 33.45 kg/m   Physical Exam Vitals and nursing note reviewed.  Constitutional:      General: She is awake. She is not in acute distress.    Appearance: Normal appearance. She is not toxic-appearing.  HENT:     Head: Normocephalic and atraumatic.     Right Ear: Hearing and ear canal normal.  Left Ear: Hearing and ear canal normal.     Nose: Congestion and rhinorrhea present. Rhinorrhea is clear.     Right Sinus: Maxillary sinus tenderness present.     Left Sinus: Maxillary sinus tenderness present.     Mouth/Throat:     Mouth: Mucous membranes are moist.     Pharynx: Uvula midline. No pharyngeal swelling, oropharyngeal exudate, posterior oropharyngeal erythema or uvula swelling.     Tonsils: No tonsillar exudate or tonsillar abscesses.  Eyes:     General: Lids are normal. Vision grossly intact.     Extraocular Movements: Extraocular movements intact.     Conjunctiva/sclera: Conjunctivae normal.      Pupils: Pupils are equal, round, and reactive to light.  Cardiovascular:     Rate and Rhythm: Normal rate and regular rhythm.     Pulses:          Radial pulses are 2+ on the right side.  Pulmonary:     Effort: Pulmonary effort is normal.     Breath sounds: Normal breath sounds. No wheezing.     Comments: Mild rhonchi auscultated in upper lobes. Patient has productive cough on exam. Patient has no difficulty speaking in complete sentences.  Abdominal:     General: Abdomen is flat.     Palpations: Abdomen is soft.     Tenderness: There is no abdominal tenderness.  Musculoskeletal:     Cervical back: Full passive range of motion without pain and neck supple. No rigidity. No spinous process tenderness.  Lymphadenopathy:     Cervical: Cervical adenopathy present.  Skin:    General: Skin is warm and dry.     Capillary Refill: Capillary refill takes less than 2 seconds.     Findings: No rash.  Neurological:     General: No focal deficit present.     Mental Status: She is alert.  Psychiatric:        Attention and Perception: Attention normal.        Mood and Affect: Mood normal.        Speech: Speech normal.     (all labs ordered are listed, but only abnormal results are displayed) Labs Reviewed  URINALYSIS, ROUTINE W REFLEX MICROSCOPIC - Abnormal; Notable for the following components:      Result Value   Color, Urine STRAW (*)    Specific Gravity, Urine 1.004 (*)    All other components within normal limits  RESP PANEL BY RT-PCR (RSV, FLU A&B, COVID)  [id number]  PREGNANCY, URINE    EKG: None  Radiology: No results found.    Procedures   Medications Ordered in the ED  acetaminophen  (TYLENOL ) tablet 650 mg (650 mg Oral Given 03/04/24 1209)  ibuprofen  (ADVIL ) tablet 800 mg (800 mg Oral Given 03/04/24 1336)  benzonatate  (TESSALON ) capsule 100 mg (100 mg Oral Given 03/04/24 1336)                                 Medical Decision Making Amount and/or Complexity of Data  Reviewed Labs: ordered. Decision-making details documented in ED Course. Radiology: ordered. Decision-making details documented in ED Course.  Risk OTC drugs. Prescription drug management.   Patient presents to the ED for: Flu-like symptoms This involves an extensive number of treatment options Differential diagnosis includes:  Infectious etiology Viral vs bacterial  Co-morbid conditions: HTN, Lupus, Hypothyroidism, sarcoidosis  Additional history/records obtained and reviewed: Additional history obtained from  outside medical  records External records from outside source obtained and reviewed including recent PCP visits for current daily medication regimen. Clinical Course as of 03/09/24 1202  Sun Mar 04, 2024  1313 Temp: 98.7 F (37.1 C) Afebrile, vital stable, patient in no acute distress [ML]  1314 Resp panel by RT-PCR (RSV, Flu A&B, Covid) Anterior Nasal Swab Negative [ML]  1330 Urinalysis, Routine w reflex microscopic -Urine, Clean Catch(!) No UTI [ML]  1330 Pregnancy, urine Negative [ML]  1331 DG Chest 2 View No acute findings per radiology - small area of possible consolidation to right lower lobe [ML]  1340 Patient given ibuprofen , acetaminophen , and benzonatate  for symptomatic relief in the ED [ML]    Clinical Course User Index [ML] Willma Duwaine CROME, PA    Data Reviewed / Actions Taken: Labs ordered/reviewed with my independent interpretation in ED course above. Imaging ordered/reviewed with my independent interpretation in ED course above. I agree with the radiologists interpretation.   Management / Treatments: See ED course above for medications, treatments administered, and clinical rationale.   Reevaluation of the patient after these medicines showed that the patient improved I have reviewed the patients home medicines and have made adjustments as needed  ED Course / Reassessments: Problem List: flu-like symptoms  33 year old female presented for flulike  symptoms. Initial assessment included history, physical exam, and review of prior medical records. Laboratory testing was obtained given clinical presentation, and viral PCR testing was negative. Given patients reported high fevers up to 103F at home, symptoms x 10 days, and worsening productive cough there is increased clinical suspicion for bacterial pneumonia requiring antibiotics. Imaging was suspicious for possible lower right lobe consolidation. Despite patient education and request, she denied laboratory testing and further workup due to time spent in the emergency department today.  Patient denied any chest pain or shortness of breath, was non-tachycardic on arrival, and overall clinical appearance makes cardiac etiology or PE unlikely. The patient was treated symptomatically with antipyretics, benzonatate , and supportive care. The patient remained stable during the ED course and was deemed appropriate for outpatient management.  Patient was prescribed antibiotic course and benzonatate  for cough. Patient has been noncompliant with at home steroid inhaler for sarcoidosis - patient's albuterol  inhaler to be refilled and short course of steroids for symptomatic relief.  Discharge planning include strict return precautions for worsening respiratory symptoms, persistent high fevers, chest pain, inability to tolerate oral intake, or new neurological symptoms.  The patient was advised on continued supportive care at home, including hydration, rest, and antipyretic use. Serial reassessments performed: Yes    Disposition: Disposition: Discharge with close follow-up with PCP for further evaluation and care. Rationale for disposition: stable for discharge  The disposition plan and rationale were discussed with the patient at the bedside, all questions were addressed, and the patient demonstrated understanding.  This note was produced using [employer]. While I have reviewed and verified all  clinical information, transcription errors may remain.      Final diagnoses:  Viral upper respiratory tract infection    ED Discharge Orders          Ordered    benzonatate  (TESSALON ) 100 MG capsule  3 times daily PRN        03/04/24 1426    doxycycline  (VIBRAMYCIN ) 100 MG capsule  2 times daily        03/04/24 1426    VENTOLIN  HFA 108 (90 Base) MCG/ACT inhaler  Every 4 hours PRN  03/04/24 1426    predniSONE  (DELTASONE ) 20 MG tablet  Daily with breakfast        03/04/24 1448               Willma Duwaine CROME, [place] 03/09/24 1227    Yolande Lamar BROCKS, MD 03/14/24 2037  "

## 2024-06-29 ENCOUNTER — Encounter (INDEPENDENT_AMBULATORY_CARE_PROVIDER_SITE_OTHER)

## 2024-06-29 ENCOUNTER — Ambulatory Visit (INDEPENDENT_AMBULATORY_CARE_PROVIDER_SITE_OTHER): Admitting: Nurse Practitioner
